# Patient Record
Sex: Male | Born: 1935 | Race: White | Hispanic: No | Marital: Married | Smoking: Never smoker
Health system: Southern US, Community
[De-identification: ages and names within clinical notes are randomized; demographics above are authoritative.]

## PROBLEM LIST (undated history)

## (undated) DIAGNOSIS — E871 Hypo-osmolality and hyponatremia: Secondary | ICD-10-CM

## (undated) DIAGNOSIS — I10 Essential (primary) hypertension: Secondary | ICD-10-CM

## (undated) HISTORY — PX: SPINAL FUSION: SHX223

## (undated) HISTORY — PX: DG ANKLE LEFT COMPLETE (ARMC HX): HXRAD1441

---

## 2016-03-06 ENCOUNTER — Emergency Department
Admission: EM | Admit: 2016-03-06 | Discharge: 2016-03-06 | Disposition: A | Discharge status: Home / Self Care | Payer: Medicare Other | Attending: Emergency Medicine | Admitting: Emergency Medicine

## 2016-03-06 ENCOUNTER — Encounter: Payer: Self-pay | Admitting: Emergency Medicine

## 2016-03-06 DIAGNOSIS — I10 Essential (primary) hypertension: Secondary | ICD-10-CM | POA: Insufficient documentation

## 2016-03-06 HISTORY — DX: Essential (primary) hypertension: I10

## 2016-03-06 LAB — CBC WITH DIFFERENTIAL/PLATELET
Basophils Absolute: 0.1 10*3/uL (ref 0–0.1)
Basophils Relative: 1 %
EOS ABS: 0.1 10*3/uL (ref 0–0.7)
Eosinophils Relative: 1 %
HEMATOCRIT: 38 % — AB (ref 40.0–52.0)
HEMOGLOBIN: 13.1 g/dL (ref 13.0–18.0)
LYMPHS ABS: 1.3 10*3/uL (ref 1.0–3.6)
LYMPHS PCT: 17 %
MCH: 31 pg (ref 26.0–34.0)
MCHC: 34.5 g/dL (ref 32.0–36.0)
MCV: 89.8 fL (ref 80.0–100.0)
MONOS PCT: 6 %
Monocytes Absolute: 0.5 10*3/uL (ref 0.2–1.0)
NEUTROS ABS: 5.9 10*3/uL (ref 1.4–6.5)
NEUTROS PCT: 75 %
Platelets: 252 10*3/uL (ref 150–440)
RBC: 4.23 MIL/uL — AB (ref 4.40–5.90)
RDW: 12.8 % (ref 11.5–14.5)
WBC: 7.8 10*3/uL (ref 3.8–10.6)

## 2016-03-06 LAB — COMPREHENSIVE METABOLIC PANEL
ALK PHOS: 81 U/L (ref 38–126)
ALT: 25 U/L (ref 17–63)
AST: 28 U/L (ref 15–41)
Albumin: 4.5 g/dL (ref 3.5–5.0)
Anion gap: 7 (ref 5–15)
BILIRUBIN TOTAL: 0.7 mg/dL (ref 0.3–1.2)
BUN: 12 mg/dL (ref 6–20)
CALCIUM: 9 mg/dL (ref 8.9–10.3)
CO2: 27 mmol/L (ref 22–32)
CREATININE: 0.67 mg/dL (ref 0.61–1.24)
Chloride: 95 mmol/L — ABNORMAL LOW (ref 101–111)
GFR calc non Af Amer: >60 mL/min (ref >60)
GLUCOSE: 98 mg/dL (ref 65–99)
Potassium: 3.6 mmol/L (ref 3.5–5.1)
SODIUM: 129 mmol/L — AB (ref 135–145)
TOTAL PROTEIN: 8 g/dL (ref 6.5–8.1)

## 2016-03-06 LAB — TROPONIN I: Troponin I: <0.03 ng/mL (ref <0.03)

## 2016-03-06 MED ORDER — METOPROLOL TARTRATE 25 MG PO TABS
12.5000 mg | ORAL_TABLET | Freq: Once | ORAL | Status: AC
  Administered 2016-03-06: 12.5 mg via ORAL
  Filled 2016-03-06: qty 1

## 2016-03-06 MED ORDER — METOPROLOL TARTRATE 25 MG PO TABS: 12.5000 mg | ORAL_TABLET | Freq: Two times a day (BID) | ORAL | 0 refills | Status: DC

## 2016-03-06 MED ORDER — LABETALOL HCL 5 MG/ML IV SOLN
10.0000 mg | Freq: Once | INTRAVENOUS | Status: AC
  Administered 2016-03-06: 10 mg via INTRAVENOUS
  Filled 2016-03-06: qty 4

## 2016-03-06 NOTE — ED Provider Notes (Signed)
[facility] Emergency Department Provider Note  ____________________________________________  Time seen: Approximately 2:38 PM  I have reviewed the triage vital signs and the nursing notes.   HISTORY  Chief Complaint Hypertension   HPI Alejandro Sheppard is a 81 y.o. male the history of hypertension who presents for evaluation of elevated blood pressure. Patient reports that his been dealing with elevated blood pressure for the last 5 years. 3 weeks ago he had his physical exam and he was found to have systolics in the 150s. His doctor increased his medication from amlodipine to and a half to 5 mg a day and patient has also been on benzapril 20mg  for 5 years. Patient has been carefully checking his blood pressure on a daily basis and this morning noticed that his systolics were in the 200s which prompted his visit to the emergency room. Patient denies headache, dizziness, changes in vision, slurred speech, facial droop, unilateral weakness or numbness, chest pain, shortness of breath, abdominal pain, back pain. Patient reports that he is completely asymptomatic and he wouldn't be here if he had not checked his blood pressure. He is concerned he might have a stroke with blood pressure so elevated.  Past Medical History:  Diagnosis Date  . Hypertension     There are no active problems to display for this patient.   History reviewed. No pertinent surgical history.  Prior to Admission medications   Medication Sig Start Date End Date Taking? Authorizing Provider  metoprolol tartrate (LOPRESSOR) 25 MG tablet Take 0.5 tablets (12.5 mg total) by mouth 2 (two) times daily. 03/06/16 03/06/17  Nita Sicklearolina Ladene Allocca, MD    Allergies Penicillins  No family history on file.  Social History Social History  Substance Use Topics  . Smoking status: Never Smoker  . Smokeless tobacco: Not on file  . Alcohol use Not on file    Review of Systems  Constitutional: Negative for  fever. Eyes: Negative for visual changes. ENT: Negative for sore throat. Neck: No neck pain  Cardiovascular: Negative for chest pain. Respiratory: Negative for shortness of breath. Gastrointestinal: Negative for abdominal pain, vomiting or diarrhea. Genitourinary: Negative for dysuria. Musculoskeletal: Negative for back pain. Skin: Negative for rash. Neurological: Negative for headaches, weakness or numbness. Psych: No SI or HI  ____________________________________________   PHYSICAL EXAM:  VITAL SIGNS: ED Triage Vitals  Enc Vitals Group     BP 03/06/16 1124 (!) 199/88     Pulse Rate 03/06/16 1124 88     Resp 03/06/16 1124 18     Temp 03/06/16 1124 97.9 F (36.6 C)     Temp Source 03/06/16 1124 Oral     SpO2 03/06/16 1124 99 %     Weight 03/06/16 1121 150 lb (68 kg)     Height 03/06/16 1121 5\' 9"  (1.753 m)     Head Circumference --      Peak Flow --      Pain Score --      Pain Loc --      Pain Edu? --      Excl. in GC? --     Constitutional: Alert and oriented. Well appearing and in no apparent distress. HEENT:      Head: Normocephalic and atraumatic.         Eyes: Conjunctivae are normal. Sclera is non-icteric. EOMI. PERRL      Mouth/Throat: Mucous membranes are moist.       Neck: Supple with no signs of meningismus. Cardiovascular: Regular rate  and rhythm. II/VI systolic murmur loudest at the apex of the heart. No gallops, or rubs. 2+ symmetrical distal pulses are present in all extremities. No JVD. Respiratory: Normal respiratory effort. Lungs are clear to auscultation bilaterally. No wheezes, crackles, or rhonchi.  Gastrointestinal: Soft, non tender, and non distended with positive bowel sounds. No rebound or guarding. Musculoskeletal: Nontender with normal range of motion in all extremities. No edema, cyanosis, or erythema of extremities. Neurologic: Normal speech and language. A & O x3, PERRL, no nystagmus, CN II-XII intact, motor testing reveals good tone and  bulk throughout. There is no evidence of pronator drift or dysmetria. Muscle strength is 5/5 throughout. Deep tendon reflexes are 2+ throughout with downgoing toes. Sensory examination is intact. Gait is normal. Skin: Skin is warm, dry and intact. No rash noted. Psychiatric: Mood and affect are normal. Speech and behavior are normal.  ____________________________________________   LABS (all labs ordered are listed, but only abnormal results are displayed)  Labs Reviewed  CBC WITH DIFFERENTIAL/PLATELET - Abnormal; Notable for the following:       Result Value   RBC 4.23 (*)    HCT 38.0 (*)    All other components within normal limits  COMPREHENSIVE METABOLIC PANEL - Abnormal; Notable for the following:    Sodium 129 (*)    Chloride 95 (*)    All other components within normal limits  TROPONIN I   ____________________________________________  EKG  ED ECG REPORT I, Nita Sicklearolina Jaelan Rasheed, the attending physician, personally viewed and interpreted this ECG.  Normal sinus rhythm, rate of 67, normal intervals, normal axis, no ST elevations or depressions. ____________________________________________  RADIOLOGY  none  ____________________________________________   PROCEDURES  Procedure(s) performed: None Procedures Critical Care performed:  None ____________________________________________   INITIAL IMPRESSION / ASSESSMENT AND PLAN / ED COURSE   81 y.o. male the history of hypertension who presents for evaluation of asymptomatic elevated blood pressure. Patient is very concerned that he will have a stroke due to elevated blood pressure. He is neurologically intact and has no symptoms of hypertensive emergency. EKG with no evidence of ischemia, kidney function is within normal limits, CBC, CMP, troponin all within normal limits. Patient's blood pressure during my examination with systolics in the 230s. We'll give him a dose of IV labetalol and start patient on metoprolol 12.5 mg  twice a day. We'll have patient follow-up with his PCP on Monday  Clinical Course as of Mar 06 1538  Fri Mar 06, 2016  1536 SPB 181 from 232, patient remains asymptomatic. Will be discharged home on lopressor 12.5mg  BID and close follow-up with PCP on Monday. Discussed return precautions with the patient and his wife for any signs of stroke, chest pain, back pain, or any other symptoms and recommended he return to the emergency room if these develop.  [CV]    Clinical Course User Index [CV] Nita Sicklearolina Ayden Hardwick, MD    Pertinent labs & imaging results that were available during my care of the patient were reviewed by me and considered in my medical decision making (see chart for details).    ____________________________________________   FINAL CLINICAL IMPRESSION(S) / ED DIAGNOSES  Final diagnoses:  Asymptomatic hypertension      NEW MEDICATIONS STARTED DURING THIS VISIT:  New Prescriptions   METOPROLOL TARTRATE (LOPRESSOR) 25 MG TABLET    Take 0.5 tablets (12.5 mg total) by mouth 2 (two) times daily.     Note:  This document was prepared using Conservation officer, historic buildingsDragon voice recognition software and may include  unintentional dictation errors.    Nita Sickle, MD 03/06/16 1539

## 2016-03-06 NOTE — ED Triage Notes (Signed)
Reports checking bp at home and it was 200/  . Denies sx.  Skin w/d, MAE, smile symmetrical.

## 2016-03-06 NOTE — ED Notes (Addendum)
PT reports having seen PCP three weeks ago and had his amlodipine increased from 2.5 mg once a day to 5mg  once a day. Pt also taking Benazepril 20 mg once a day for HTN. PT has BP trends documented at bedside. BP has been elevated and slowly increasing throughout the week with highest readings starting today.

## 2017-03-14 ENCOUNTER — Emergency Department: Payer: Medicare Other

## 2017-03-14 ENCOUNTER — Encounter: Payer: Self-pay | Admitting: Emergency Medicine

## 2017-03-14 ENCOUNTER — Other Ambulatory Visit: Payer: Self-pay

## 2017-03-14 ENCOUNTER — Emergency Department
Admission: EM | Admit: 2017-03-14 | Discharge: 2017-03-14 | Disposition: A | Discharge status: Home / Self Care | Payer: Medicare Other | Attending: Student in an Organized Health Care Education/Training Program | Admitting: Student in an Organized Health Care Education/Training Program

## 2017-03-14 DIAGNOSIS — I10 Essential (primary) hypertension: Secondary | ICD-10-CM | POA: Diagnosis not present

## 2017-03-14 LAB — BASIC METABOLIC PANEL
Anion gap: 9 (ref 5–15)
BUN: 15 mg/dL (ref 6–20)
CALCIUM: 9.2 mg/dL (ref 8.9–10.3)
CO2: 24 mmol/L (ref 22–32)
CREATININE: 0.72 mg/dL (ref 0.61–1.24)
Chloride: 98 mmol/L — ABNORMAL LOW (ref 101–111)
GFR calc Af Amer: >60 mL/min (ref >60)
GFR calc non Af Amer: >60 mL/min (ref >60)
GLUCOSE: 99 mg/dL (ref 65–99)
Potassium: 4.2 mmol/L (ref 3.5–5.1)
Sodium: 131 mmol/L — ABNORMAL LOW (ref 135–145)

## 2017-03-14 MED ORDER — AMLODIPINE BESYLATE 5 MG PO TABS
5.0000 mg | ORAL_TABLET | Freq: Once | ORAL | Status: AC
  Administered 2017-03-14: 5 mg via ORAL

## 2017-03-14 MED ORDER — AMLODIPINE BESYLATE 5 MG PO TABS
ORAL_TABLET | ORAL | Status: AC
  Filled 2017-03-14: qty 1

## 2017-03-14 NOTE — ED Provider Notes (Signed)
[facility] Emergency Department Provider Note    First MD Initiated Contact with Patient 03/14/17 1833     (approximate)  I have reviewed the triage vital signs and the nursing notes.   HISTORY  Chief Complaint Hypertension    HPI Alejandro Sheppard is a 82 y.o. male with a history of hypertension with no recent hospitalizations presents for evaluation of elevated blood pressure.  States he has not missed any doses.  He is asymptomatic.  He denies any blurry vision, headache, numbness, weakness, chest pain, shortness of breath, lower extremity swelling.  Has had episodes of blood pressure spikes roughly 3-4 times over the past several years.  Does follow with PCP.  Denies any new medications, stressors  Past Medical History:  Diagnosis Date  . Hypertension    History reviewed. No pertinent family history. History reviewed. No pertinent surgical history. There are no active problems to display for this patient.     Prior to Admission medications   Medication Sig Start Date End Date Taking? Authorizing Provider  metoprolol tartrate (LOPRESSOR) 25 MG tablet Take 0.5 tablets (12.5 mg total) by mouth 2 (two) times daily. 03/06/16 03/06/17  Nita SickleVeronese, [place], MD    Allergies Penicillins    Social History Social History   Tobacco Use  . Smoking status: Never Smoker  . Smokeless tobacco: Never Used  Substance Use Topics  . Alcohol use: No    Frequency: Never  . Drug use: No    Review of Systems Patient denies headaches, rhinorrhea, blurry vision, numbness, shortness of breath, chest pain, edema, cough, abdominal pain, nausea, vomiting, diarrhea, dysuria, fevers, rashes or hallucinations unless otherwise stated above in HPI. ____________________________________________   PHYSICAL EXAM:  VITAL SIGNS: Vitals:   03/14/17 1824  BP: (!) 216/93  Pulse: (!) 59  Resp: 16  Temp: 98.5 F (36.9 C)  SpO2: 99%    Constitutional: Alert and  oriented. Well appearing and in no acute distress. Eyes: Conjunctivae are normal.  Head: Atraumatic. Nose: No congestion/rhinnorhea. Mouth/Throat: Mucous membranes are moist.   Neck: Painless ROM.  Cardiovascular:   Good peripheral circulation. Respiratory: Normal respiratory effort.  No retractions.  Gastrointestinal: Soft and nontender.  Musculoskeletal: No lower extremity tenderness .  No joint effusions. Neurologic:  Normal speech and language. No gross focal neurologic deficits are appreciated.  Skin:  Skin is warm, dry and intact. No rash noted. Psychiatric: Mood and affect are normal. Speech and behavior are normal.  ____________________________________________   LABS (all labs ordered are listed, but only abnormal results are displayed)  No results found for this or any previous visit (from the past 24 hour(s)). ____________________________________________  EKG My review and personal interpretation at Time: 19:19   Indication: htn  Rate: 50  Rhythm: sinus Axis: normal  Other: normal intervals, no stemi ____________________________________________  RADIOLOGY  I personally reviewed all radiographic images ordered to evaluate for the above acute complaints and reviewed radiology reports and findings.  These findings were personally discussed with the patient.  Please see medical record for radiology report.  ____________________________________________   PROCEDURES  Procedure(s) performed:  Procedures    Critical Care performed: no ____________________________________________   INITIAL IMPRESSION / ASSESSMENT AND PLAN / ED COURSE  Pertinent labs & imaging results that were available during my care of the patient were reviewed by me and considered in my medical decision making (see chart for details).  DDX: htn, htnive urgency, medication noncompliance, aki  Alejandro Sheppard is a 82 y.o.  who presents to the ED with asymptomatic hypertension.  EKG shows no evidence  of acute ischemia.  BMP shows normal renal function.  Chest x-ray with no evidence of cardiomegaly or acute heart failure.  Patient given dose of his oral amlodipine with improvement in his blood pressure.  He has no signs or symptoms of CVA.  This point I do believe patient is stable and appropriate for follow-up with PCP.      ____________________________________________   FINAL CLINICAL IMPRESSION(S) / ED DIAGNOSES  Final diagnoses:  Asymptomatic hypertension      NEW MEDICATIONS STARTED DURING THIS VISIT:  This SmartLink is deprecated. Use AVSMEDLIST instead to display the medication list for a patient.   Note:  This document was prepared using Dragon voice recognition software and may include unintentional dictation errors.     Willy Eddyobinson, Yuritzi Kamp, MD 03/14/17 [phone]

## 2017-03-14 NOTE — ED Notes (Signed)
First nurse note  Presents with family with high blood pressure  Denies any sx's

## 2017-03-14 NOTE — ED Triage Notes (Signed)
Pt here today for elevated BP at home.  Hx HTN.  Reports couple times a year gets episode where BP increases.  Denies any other symptoms such as headache, CP, vision changes, or nose bleeds.

## 2018-05-03 ENCOUNTER — Other Ambulatory Visit: Payer: Self-pay | Admitting: Student

## 2018-05-03 DIAGNOSIS — R1310 Dysphagia, unspecified: Secondary | ICD-10-CM

## 2018-05-10 ENCOUNTER — Ambulatory Visit
Admission: RE | Admit: 2018-05-10 | Discharge: 2018-05-10 | Disposition: A | Discharge status: Home / Self Care | Payer: Medicare Other | Source: Ambulatory Visit | Attending: Student | Admitting: Student

## 2018-05-10 ENCOUNTER — Other Ambulatory Visit: Payer: Self-pay | Admitting: Student

## 2018-05-10 DIAGNOSIS — R1310 Dysphagia, unspecified: Secondary | ICD-10-CM

## 2018-09-27 ENCOUNTER — Other Ambulatory Visit: Payer: Self-pay | Admitting: Student

## 2018-09-27 DIAGNOSIS — R05 Cough: Secondary | ICD-10-CM

## 2018-09-27 DIAGNOSIS — R1310 Dysphagia, unspecified: Secondary | ICD-10-CM

## 2018-09-27 DIAGNOSIS — R053 Chronic cough: Secondary | ICD-10-CM

## 2018-09-30 ENCOUNTER — Emergency Department: Payer: Medicare Other

## 2018-09-30 ENCOUNTER — Other Ambulatory Visit: Payer: Self-pay

## 2018-09-30 ENCOUNTER — Inpatient Hospital Stay
Admission: EM | Admit: 2018-09-30 | Discharge: 2018-10-01 | DRG: 641 | Disposition: A | Discharge status: Home / Self Care | Payer: Medicare Other | Attending: Internal Medicine | Admitting: Internal Medicine

## 2018-09-30 ENCOUNTER — Encounter: Payer: Self-pay | Admitting: Emergency Medicine

## 2018-09-30 DIAGNOSIS — E785 Hyperlipidemia, unspecified: Secondary | ICD-10-CM | POA: Diagnosis not present

## 2018-09-30 DIAGNOSIS — Z981 Arthrodesis status: Secondary | ICD-10-CM

## 2018-09-30 DIAGNOSIS — F418 Other specified anxiety disorders: Secondary | ICD-10-CM | POA: Diagnosis present

## 2018-09-30 DIAGNOSIS — Z823 Family history of stroke: Secondary | ICD-10-CM | POA: Diagnosis not present

## 2018-09-30 DIAGNOSIS — R296 Repeated falls: Secondary | ICD-10-CM | POA: Diagnosis present

## 2018-09-30 DIAGNOSIS — I10 Essential (primary) hypertension: Secondary | ICD-10-CM | POA: Diagnosis not present

## 2018-09-30 DIAGNOSIS — E871 Hypo-osmolality and hyponatremia: Principal | ICD-10-CM | POA: Diagnosis present

## 2018-09-30 DIAGNOSIS — R531 Weakness: Secondary | ICD-10-CM

## 2018-09-30 DIAGNOSIS — Z880 Allergy status to penicillin: Secondary | ICD-10-CM | POA: Diagnosis not present

## 2018-09-30 DIAGNOSIS — Z1159 Encounter for screening for other viral diseases: Secondary | ICD-10-CM | POA: Diagnosis not present

## 2018-09-30 DIAGNOSIS — Z818 Family history of other mental and behavioral disorders: Secondary | ICD-10-CM

## 2018-09-30 DIAGNOSIS — G4700 Insomnia, unspecified: Secondary | ICD-10-CM | POA: Diagnosis present

## 2018-09-30 DIAGNOSIS — E860 Dehydration: Secondary | ICD-10-CM | POA: Diagnosis not present

## 2018-09-30 DIAGNOSIS — Z79899 Other long term (current) drug therapy: Secondary | ICD-10-CM | POA: Diagnosis not present

## 2018-09-30 HISTORY — DX: Hypo-osmolality and hyponatremia: E87.1

## 2018-09-30 LAB — CBC
HCT: 34.2 % — ABNORMAL LOW (ref 39.0–52.0)
Hemoglobin: 12.3 g/dL — ABNORMAL LOW (ref 13.0–17.0)
MCH: 31.5 pg (ref 26.0–34.0)
MCHC: 36 g/dL (ref 30.0–36.0)
MCV: 87.5 fL (ref 80.0–100.0)
Platelets: 170 10*3/uL (ref 150–400)
RBC: 3.91 MIL/uL — ABNORMAL LOW (ref 4.22–5.81)
RDW: 12.2 % (ref 11.5–15.5)
WBC: 6.5 10*3/uL (ref 4.0–10.5)
nRBC: 0 % (ref 0.0–0.2)

## 2018-09-30 LAB — BASIC METABOLIC PANEL
Anion gap: 12 (ref 5–15)
BUN: 11 mg/dL (ref 8–23)
CO2: 23 mmol/L (ref 22–32)
Calcium: 9.3 mg/dL (ref 8.9–10.3)
Chloride: 87 mmol/L — ABNORMAL LOW (ref 98–111)
Creatinine, Ser: 0.78 mg/dL (ref 0.61–1.24)
GFR calc Af Amer: >60 mL/min (ref >60)
GFR calc non Af Amer: >60 mL/min (ref >60)
Glucose, Bld: 109 mg/dL — ABNORMAL HIGH (ref 70–99)
Potassium: 3.9 mmol/L (ref 3.5–5.1)
Sodium: 122 mmol/L — ABNORMAL LOW (ref 135–145)

## 2018-09-30 LAB — HEPATIC FUNCTION PANEL
ALT: 21 U/L (ref 0–44)
AST: 25 U/L (ref 15–41)
Albumin: 4.5 g/dL (ref 3.5–5.0)
Alkaline Phosphatase: 53 U/L (ref 38–126)
Bilirubin, Direct: 0.2 mg/dL (ref 0.0–0.2)
Indirect Bilirubin: 0.6 mg/dL (ref 0.3–0.9)
Total Bilirubin: 0.8 mg/dL (ref 0.3–1.2)
Total Protein: 6.7 g/dL (ref 6.5–8.1)

## 2018-09-30 LAB — OSMOLALITY, URINE: Osmolality, Ur: 81 mOsm/kg — ABNORMAL LOW (ref 300–900)

## 2018-09-30 LAB — SODIUM, URINE, RANDOM: Sodium, Ur: <10 mmol/L

## 2018-09-30 LAB — SARS CORONAVIRUS 2 BY RT PCR (HOSPITAL ORDER, PERFORMED IN ~~LOC~~ HOSPITAL LAB): SARS Coronavirus 2: NEGATIVE

## 2018-09-30 MED ORDER — ONDANSETRON HCL 4 MG PO TABS: 4.0000 mg | ORAL_TABLET | Freq: Four times a day (QID) | ORAL | Status: DC | PRN

## 2018-09-30 MED ORDER — ONDANSETRON HCL 4 MG/2ML IJ SOLN: 4.0000 mg | Freq: Four times a day (QID) | INTRAMUSCULAR | Status: DC | PRN

## 2018-09-30 MED ORDER — ATORVASTATIN CALCIUM 20 MG PO TABS
40.0000 mg | ORAL_TABLET | Freq: Every day | ORAL | Status: DC
  Administered 2018-09-30: 19:00:00 40 mg via ORAL
  Filled 2018-09-30: qty 2

## 2018-09-30 MED ORDER — TRAZODONE HCL 50 MG PO TABS
50.0000 mg | ORAL_TABLET | Freq: Every day | ORAL | Status: DC
  Administered 2018-09-30: 50 mg via ORAL
  Filled 2018-09-30: qty 1

## 2018-09-30 MED ORDER — SODIUM CHLORIDE 0.9 % IV BOLUS
500.0000 mL | Freq: Once | INTRAVENOUS | Status: AC
  Administered 2018-09-30: 500 mL via INTRAVENOUS

## 2018-09-30 MED ORDER — METOPROLOL TARTRATE 25 MG PO TABS
25.0000 mg | ORAL_TABLET | Freq: Two times a day (BID) | ORAL | Status: DC
  Administered 2018-10-01: 10:00:00 25 mg via ORAL
  Filled 2018-09-30 (×2): qty 1

## 2018-09-30 MED ORDER — ASPIRIN EC 81 MG PO TBEC
81.0000 mg | DELAYED_RELEASE_TABLET | Freq: Every day | ORAL | Status: DC
  Administered 2018-10-01: 81 mg via ORAL
  Filled 2018-09-30: qty 1

## 2018-09-30 MED ORDER — CITALOPRAM HYDROBROMIDE 20 MG PO TABS
10.0000 mg | ORAL_TABLET | Freq: Every day | ORAL | Status: DC
  Administered 2018-10-01: 10 mg via ORAL
  Filled 2018-09-30: qty 1

## 2018-09-30 MED ORDER — BENAZEPRIL HCL 20 MG PO TABS
20.0000 mg | ORAL_TABLET | Freq: Every day | ORAL | Status: DC
  Administered 2018-10-01: 20 mg via ORAL
  Filled 2018-09-30: qty 1

## 2018-09-30 MED ORDER — ACETAMINOPHEN 650 MG RE SUPP: 650.0000 mg | Freq: Four times a day (QID) | RECTAL | Status: DC | PRN

## 2018-09-30 MED ORDER — SODIUM CHLORIDE 0.9 % IV SOLN
INTRAVENOUS | Status: DC
  Administered 2018-09-30: 19:00:00 via INTRAVENOUS

## 2018-09-30 MED ORDER — AMLODIPINE BESYLATE 5 MG PO TABS
10.0000 mg | ORAL_TABLET | Freq: Every day | ORAL | Status: DC
  Administered 2018-10-01: 10 mg via ORAL
  Filled 2018-09-30: qty 2

## 2018-09-30 MED ORDER — ACETAMINOPHEN 325 MG PO TABS
650.0000 mg | ORAL_TABLET | Freq: Four times a day (QID) | ORAL | Status: DC | PRN
  Administered 2018-10-01: 650 mg via ORAL
  Filled 2018-09-30: qty 2

## 2018-09-30 MED ORDER — ENOXAPARIN SODIUM 40 MG/0.4ML ~~LOC~~ SOLN
40.0000 mg | SUBCUTANEOUS | Status: DC
  Administered 2018-09-30: 40 mg via SUBCUTANEOUS
  Filled 2018-09-30: qty 0.4

## 2018-09-30 NOTE — H&P (Signed)
[facility] [place] [facility]   PATIENT NAME: Alejandro Sheppard Domingo    MR#:  [id number]  DATE OF BIRTH:  1934-07-05  DATE OF ADMISSION:  09/30/2018  PRIMARY CARE PHYSICIAN: Dr. Burnett ShengHedrick  REQUESTING/REFERRING PHYSICIAN: Dr. Willy EddyPatrick Robinson  CHIEF COMPLAINT:   Chief Complaint  Patient presents with  . Abnormal Labs    HISTORY OF PRESENT ILLNESS:  Alejandro Sheppard Hedberg  is a 84 y.o. male with a known history of hypertension and hyponatremia presents to the hospital after being called by his doctor to come in because of low sodium.  The patient states he takes hydrochlorothiazide at home.  His doctor called him yesterday because of the low sodium and told him to stop the hydrochlorothiazide and drink some water.  They repeated blood test today and sodium was still low and they referred him to the ER.  In the ER his sodium is 122.  Hospitalist services contacted for further evaluation.  Of note the patient did have a fall about 10 days ago and bruising bilateral face and around the eyes.  I asked him where his hands were when he fell.  The patient stated that he could not get his hands up in time.  PAST MEDICAL HISTORY:   Past Medical History:  Diagnosis Date  . Hypertension   . Hyponatremia     PAST SURGICAL HISTORY:   Past Surgical History:  Procedure Laterality Date  . DG ANKLE LEFT COMPLETE ([facility] HX)    . SPINAL FUSION      SOCIAL HISTORY:   Social History   Tobacco Use  . Smoking status: Never Smoker  . Smokeless tobacco: Never Used  Substance Use Topics  . Alcohol use: No    Frequency: Never    FAMILY HISTORY:   Family History  Problem Relation Age of Onset  . CVA Mother   . Depression Father     DRUG ALLERGIES:   Allergies  Allergen Reactions  . Penicillins Rash    REVIEW OF SYSTEMS:  CONSTITUTIONAL: No fever, fatigue or weakness.  EYES: No blurred or double vision.  EARS, NOSE, AND THROAT: No tinnitus or ear pain. No sore  throat RESPIRATORY: Chronic cough cough, no shortness of breath, wheezing or hemoptysis.  CARDIOVASCULAR: No chest pain, orthopnea, edema.  GASTROINTESTINAL: No nausea, vomiting, diarrhea or abdominal pain. No blood in bowel movements GENITOURINARY: No dysuria, hematuria.  ENDOCRINE: No polyuria, nocturia,  HEMATOLOGY: No anemia, easy bruising or bleeding SKIN: No rash or lesion. MUSCULOSKELETAL: No joint pain or arthritis.   NEUROLOGIC: No tingling, numbness, weakness.  PSYCHIATRY: No anxiety or depression.   MEDICATIONS AT HOME:   Prior to Admission medications   Medication Sig Start Date End Date Taking? Authorizing Provider  metoprolol tartrate (LOPRESSOR) 25 MG tablet Take 0.5 tablets (12.5 mg total) by mouth 2 (two) times daily. 03/06/16 03/06/17  Nita SickleVeronese, [place], MD   Medication reconciliation still needs to be done.  Benazepril 20 mg daily, Norvasc 10 mg daily, metoprolol 25 mg twice daily, hydrochlorothiazide 12.5 mg daily, aspirin 81 mg daily, Celexa 10 mg daily, trazodone 50 mg daily and Lipitor 40 mg nightly.  VITAL SIGNS:  Blood pressure (!) 151/57, pulse (!) 53, temperature 99.7 F (37.6 C), temperature source Oral, resp. rate 18, SpO2 100 %.  PHYSICAL EXAMINATION:  GENERAL:  84 y.o.-year-old patient lying in the bed with no acute distress.  EYES: Pupils equal, round, reactive to light and accommodation. No scleral icterus. Extraocular muscles intact.  HEENT: Head atraumatic,  normocephalic. Oropharynx and nasopharynx clear.  NECK:  Supple, no jugular venous distention. No thyroid enlargement, no tenderness.  LUNGS: Normal breath sounds bilaterally, no wheezing, rales,rhonchi or crepitation. No use of accessory muscles of respiration.  CARDIOVASCULAR: S1, S2 normal. No murmurs, rubs, or gallops.  ABDOMEN: Soft, nontender, nondistended. Bowel sounds present. No organomegaly or mass.  EXTREMITIES: No pedal edema, cyanosis, or clubbing.  NEUROLOGIC: Cranial nerves II  through XII are intact. Muscle strength 5/5 in all extremities. Sensation intact. Gait not checked.  PSYCHIATRIC: The patient is alert and oriented x 3.  SKIN: Bruising bilateral face and around the eyes.  LABORATORY PANEL:   CBC Recent Labs  Lab 09/30/18 1235  WBC 6.5  HGB 12.3*  HCT 34.2*  PLT 170   ------------------------------------------------------------------------------------------------------------------  Chemistries  Recent Labs  Lab 09/30/18 1235  NA 122*  K 3.9  CL 87*  CO2 23  GLUCOSE 109*  BUN 11  CREATININE 0.78  CALCIUM 9.3  AST 25  ALT 21  ALKPHOS 53  BILITOT 0.8   ------------------------------------------------------------------------------------------------------------------    RADIOLOGY:  Ct Head Wo Contrast  Result Date: 09/30/2018 CLINICAL DATA:  Facial swelling and neck pain after fall 10 days ago. EXAM: CT HEAD WITHOUT CONTRAST CT MAXILLOFACIAL WITHOUT CONTRAST CT CERVICAL SPINE WITHOUT CONTRAST TECHNIQUE: Multidetector CT imaging of the head, cervical spine, and maxillofacial structures were performed using the standard protocol without intravenous contrast. Multiplanar CT image reconstructions of the cervical spine and maxillofacial structures were also generated. COMPARISON:  None. FINDINGS: CT HEAD FINDINGS Brain: Mild chronic ischemic white matter disease is noted. No mass effect or midline shift is noted. Ventricular size is within normal limits. There is no evidence of mass lesion, hemorrhage or acute infarction. Vascular: No hyperdense vessel or unexpected calcification. Skull: Normal. Negative for fracture or focal lesion. Other: None. CT MAXILLOFACIAL FINDINGS Osseous: No fracture or mandibular dislocation. No destructive process. Orbits: Negative. No traumatic or inflammatory finding. Sinuses: Clear. Soft tissues: Negative. CT CERVICAL SPINE FINDINGS Alignment: Grade 1 retrolisthesis of C3-4 is noted secondary to severe degenerative disc  disease at this level. Skull base and vertebrae: No acute fracture. No primary bone lesion or focal pathologic process. Soft tissues and spinal canal: No prevertebral fluid or swelling. No visible canal hematoma. Disc levels: There is fusion of the C4-5, C5-6 and C6-7 disc spaces, most likely due to degenerative change. Severe degenerative disc disease is also noted at C7-T1. Upper chest: Negative. Other: Degenerative changes are seen involving posterior facet joints bilaterally. IMPRESSION: Mild chronic ischemic white matter disease. No acute intracranial abnormality seen. No abnormality seen in maxillofacial region. Extensive multilevel degenerative disc disease is noted. No acute abnormality seen in the cervical spine. Electronically Signed   By: Marijo Conception M.D.   On: 09/30/2018 14:12   Ct Cervical Spine Wo Contrast  Result Date: 09/30/2018 CLINICAL DATA:  Facial swelling and neck pain after fall 10 days ago. EXAM: CT HEAD WITHOUT CONTRAST CT MAXILLOFACIAL WITHOUT CONTRAST CT CERVICAL SPINE WITHOUT CONTRAST TECHNIQUE: Multidetector CT imaging of the head, cervical spine, and maxillofacial structures were performed using the standard protocol without intravenous contrast. Multiplanar CT image reconstructions of the cervical spine and maxillofacial structures were also generated. COMPARISON:  None. FINDINGS: CT HEAD FINDINGS Brain: Mild chronic ischemic white matter disease is noted. No mass effect or midline shift is noted. Ventricular size is within normal limits. There is no evidence of mass lesion, hemorrhage or acute infarction. Vascular: No hyperdense vessel or unexpected calcification. Skull:  Normal. Negative for fracture or focal lesion. Other: None. CT MAXILLOFACIAL FINDINGS Osseous: No fracture or mandibular dislocation. No destructive process. Orbits: Negative. No traumatic or inflammatory finding. Sinuses: Clear. Soft tissues: Negative. CT CERVICAL SPINE FINDINGS Alignment: Grade 1  retrolisthesis of C3-4 is noted secondary to severe degenerative disc disease at this level. Skull base and vertebrae: No acute fracture. No primary bone lesion or focal pathologic process. Soft tissues and spinal canal: No prevertebral fluid or swelling. No visible canal hematoma. Disc levels: There is fusion of the C4-5, C5-6 and C6-7 disc spaces, most likely due to degenerative change. Severe degenerative disc disease is also noted at C7-T1. Upper chest: Negative. Other: Degenerative changes are seen involving posterior facet joints bilaterally. IMPRESSION: Mild chronic ischemic white matter disease. No acute intracranial abnormality seen. No abnormality seen in maxillofacial region. Extensive multilevel degenerative disc disease is noted. No acute abnormality seen in the cervical spine. Electronically Signed   By: Lupita RaiderJames  Green Jr M.D.   On: 09/30/2018 14:12   Dg Chest Portable 1 View  Result Date: 09/30/2018 CLINICAL DATA:  Hyponatremia.  Weakness. EXAM: PORTABLE CHEST 1 VIEW COMPARISON:  March 14, 2017 FINDINGS: There is no edema or consolidation. The heart size and pulmonary vascularity are normal. No adenopathy. There is aortic atherosclerosis. There is degenerative change in each shoulder. There is colonic interposition between the right hemidiaphragm and liver. IMPRESSION: No edema or consolidation. Stable cardiac silhouette. Aortic Atherosclerosis (ICD10-I70.0). Electronically Signed   By: Bretta BangWilliam  Woodruff III M.D.   On: 09/30/2018 13:22   Ct Maxillofacial Wo Contrast  Result Date: 09/30/2018 CLINICAL DATA:  Facial swelling and neck pain after fall 10 days ago. EXAM: CT HEAD WITHOUT CONTRAST CT MAXILLOFACIAL WITHOUT CONTRAST CT CERVICAL SPINE WITHOUT CONTRAST TECHNIQUE: Multidetector CT imaging of the head, cervical spine, and maxillofacial structures were performed using the standard protocol without intravenous contrast. Multiplanar CT image reconstructions of the cervical spine and  maxillofacial structures were also generated. COMPARISON:  None. FINDINGS: CT HEAD FINDINGS Brain: Mild chronic ischemic white matter disease is noted. No mass effect or midline shift is noted. Ventricular size is within normal limits. There is no evidence of mass lesion, hemorrhage or acute infarction. Vascular: No hyperdense vessel or unexpected calcification. Skull: Normal. Negative for fracture or focal lesion. Other: None. CT MAXILLOFACIAL FINDINGS Osseous: No fracture or mandibular dislocation. No destructive process. Orbits: Negative. No traumatic or inflammatory finding. Sinuses: Clear. Soft tissues: Negative. CT CERVICAL SPINE FINDINGS Alignment: Grade 1 retrolisthesis of C3-4 is noted secondary to severe degenerative disc disease at this level. Skull base and vertebrae: No acute fracture. No primary bone lesion or focal pathologic process. Soft tissues and spinal canal: No prevertebral fluid or swelling. No visible canal hematoma. Disc levels: There is fusion of the C4-5, C5-6 and C6-7 disc spaces, most likely due to degenerative change. Severe degenerative disc disease is also noted at C7-T1. Upper chest: Negative. Other: Degenerative changes are seen involving posterior facet joints bilaterally. IMPRESSION: Mild chronic ischemic white matter disease. No acute intracranial abnormality seen. No abnormality seen in maxillofacial region. Extensive multilevel degenerative disc disease is noted. No acute abnormality seen in the cervical spine. Electronically Signed   By: Lupita RaiderJames  Green Jr M.D.   On: 09/30/2018 14:12     IMPRESSION AND PLAN:   1.  Severe hyponatremia.  Gentle IV fluids with normal saline.  Discontinue hydrochlorothiazide.  I told the patient and the patient's wife that this medication must go in the garbage.  He  should never take hydrochlorothiazide again.  The other medication that can cause low sodium is Celexa but I will continue that low dose for right now.  If sodium still remains low  this may be another medication to discontinue. 2.  Hypertension.  Continue Norvasc, lower the dose of metoprolol, continue benazepril 3.  Hyperlipidemia unspecified on Lipitor 4.  Depression anxiety and insomnia continue Celexa and trazodone.    All the records are reviewed and case discussed with ED provider. Management plans discussed with the patient, family and they are in agreement.  CODE STATUS: Full code  TOTAL TIME TAKING CARE OF THIS PATIENT: 50 minutes.    Alford Highlandichard Spyridon Hornstein M.D on 09/30/2018 at 3:35 PM  Between 7am to 6pm - Pager - [phone]  After 6pm call admission pager [phone]  [facility] Office  [phone]  CC: Primary care physician; Dr. Burnett ShengHedrick

## 2018-09-30 NOTE — ED Provider Notes (Signed)
[facility] Emergency Department Provider Note    First MD Initiated Contact with Patient 09/30/18 1229     (approximate)  I have reviewed the triage vital signs and the nursing notes.   HISTORY  Chief Complaint Abnormal Labs    HPI Alejandro Sheppard is a 84 y.o. male to the ER due to several days and weeks of progressively worsening weakness of fatigue.  Has been found to have falling sodium levels.  Has had several falls 110 days ago resulting in head injury and has bruising to the face.  Denies any numbness or tingling.  No witnessed seizure activity.  Was recently told to stop taking his HCTZ and follow-up for recheck but his sodium has continued to downtrend.  Denies any nausea or vomiting.  No diarrhea.    Past Medical History:  Diagnosis Date   Hypertension    Hyponatremia    Family History  Problem Relation Age of Onset   CVA Mother    Depression Father    Past Surgical History:  Procedure Laterality Date   DG ANKLE LEFT COMPLETE ([place] HX)     SPINAL FUSION     Patient Active Problem List   Diagnosis Date Noted   Hyponatremia 09/30/2018      Prior to Admission medications   Medication Sig Start Date End Date Taking? Authorizing Provider  amLODipine (NORVASC) 10 MG tablet Take 10 mg by mouth daily. 08/23/18  Yes [provider]  aspirin EC 81 MG tablet Take 81 mg by mouth at bedtime. 12/10/08  Yes [provider]  atorvastatin (LIPITOR) 40 MG tablet Take 40 mg by mouth every evening.    Yes [provider]  benazepril (LOTENSIN) 40 MG tablet Take 40 mg by mouth daily. 05/02/18 05/02/19 Yes [provider]  citalopram (CELEXA) 20 MG tablet Take 30 mg by mouth daily. 09/13/18  Yes [provider]  metoprolol tartrate (LOPRESSOR) 50 MG tablet Take 50 mg by mouth 2 (two) times a day. 12/08/17  Yes [provider]  polyvinyl alcohol (ARTIFICIAL TEARS) 1.4 % ophthalmic solution Apply 1-2 drops  to eye daily as needed.   Yes [provider]  traZODone (DESYREL) 50 MG tablet Take 250 mg by mouth at bedtime. 09/13/18  Yes [provider]    Allergies Penicillins    Social History Social History   Tobacco Use   Smoking status: Never Smoker   Smokeless tobacco: Never Used  Substance Use Topics   Alcohol use: No    Frequency: Never   Drug use: No    Review of Systems Patient denies headaches, rhinorrhea, blurry vision, numbness, shortness of breath, chest pain, edema, cough, abdominal pain, nausea, vomiting, diarrhea, dysuria, fevers, rashes or hallucinations unless otherwise stated above in HPI. ____________________________________________   PHYSICAL EXAM:  VITAL SIGNS: Vitals:   09/30/18 1500 09/30/18 1505  BP:  (!) 151/57  Pulse: (!) 54 (!) 53  Resp: 10 18  Temp:    SpO2: 99% 100%    Constitutional: Alert, pleasant and cooperative Eyes: Conjunctivae are normal. EOMI Head: ecchymosis of right forehead and periorbital  Nose: No congestion/rhinnorhea. Mouth/Throat: Mucous membranes are moist.   Neck: No stridor. Painless ROM.  Cardiovascular: Normal rate, regular rhythm. Grossly normal heart sounds.  Good peripheral circulation. Respiratory: Normal respiratory effort.  No retractions. Lungs CTAB. Gastrointestinal: Soft and nontender. No distention. No abdominal bruits. No CVA tenderness. Genitourinary:  Musculoskeletal: No lower extremity tenderness nor edema.  No joint effusions.  Neurologic:  Normal speech and language. No gross focal neurologic deficits are appreciated. No facial droop Skin:  Skin is warm, dry and intact. No rash noted. Psychiatric: Mood and affect are normal. Speech and behavior are normal.  ____________________________________________   LABS (all labs ordered are listed, but only abnormal results are displayed)  Results for orders placed or performed during the hospital encounter of 09/30/18 (from the past 24  hour(s))  Basic metabolic panel     Status: Abnormal   Collection Time: 09/30/18 12:35 PM  Result Value Ref Range   Sodium 122 (L) 135 - 145 mmol/L   Potassium 3.9 3.5 - 5.1 mmol/L   Chloride 87 (L) 98 - 111 mmol/L   CO2 23 22 - 32 mmol/L   Glucose, Bld 109 (H) 70 - 99 mg/dL   BUN 11 8 - 23 mg/dL   Creatinine, Ser 1.610.78 0.61 - 1.24 mg/dL   Calcium 9.3 8.9 - 09.610.3 mg/dL   GFR calc non Af Amer >60 >60 mL/min   GFR calc Af Amer >60 >60 mL/min   Anion gap 12 5 - 15  CBC     Status: Abnormal   Collection Time: 09/30/18 12:35 PM  Result Value Ref Range   WBC 6.5 4.0 - 10.5 K/uL   RBC 3.91 (L) 4.22 - 5.81 MIL/uL   Hemoglobin 12.3 (L) 13.0 - 17.0 g/dL   HCT 04.534.2 (L) 40.939.0 - 81.152.0 %   MCV 87.5 80.0 - 100.0 fL   MCH 31.5 26.0 - 34.0 pg   MCHC 36.0 30.0 - 36.0 g/dL   RDW 91.412.2 78.211.5 - 95.615.5 %   Platelets 170 150 - 400 K/uL   nRBC 0.0 0.0 - 0.2 %  Hepatic function panel     Status: None   Collection Time: 09/30/18 12:35 PM  Result Value Ref Range   Total Protein 6.7 6.5 - 8.1 g/dL   Albumin 4.5 3.5 - 5.0 g/dL   AST 25 15 - 41 U/L   ALT 21 0 - 44 U/L   Alkaline Phosphatase 53 38 - 126 U/L   Total Bilirubin 0.8 0.3 - 1.2 mg/dL   Bilirubin, Direct 0.2 0.0 - 0.2 mg/dL   Indirect Bilirubin 0.6 0.3 - 0.9 mg/dL  SARS Coronavirus 2 (CEPHEID- Performed [facility] lab), Hosp Order     Status: None   Collection Time: 09/30/18 12:54 PM   Specimen: Nasopharyngeal Swab  Result Value Ref Range   SARS Coronavirus 2 NEGATIVE NEGATIVE   ____________________________________________  EKG My review and personal interpretation at Time: 12:53   Indication: weakness  Rate: 50  Rhythm: sinus Axis: normal Other: normal, normal intervals, no stemi ____________________________________________  RADIOLOGY  I personally reviewed all radiographic images ordered to evaluate for the above acute complaints and reviewed radiology reports and findings.  These findings were personally discussed with the  patient.  Please see medical record for radiology report.  ____________________________________________   PROCEDURES  Procedure(s) performed:  .Critical Care Performed by: Willy Eddyobinson, Jin Capote, MD Authorized by: Willy Eddyobinson, Shatera Rennert, MD   Critical care provider statement:    Critical care time (minutes):  30   Critical care was necessary to treat or prevent imminent or life-threatening deterioration of the following conditions:  Metabolic crisis   Critical care was time spent personally by me on the following activities:  Discussions with consultants, evaluation of patient's response to treatment, examination of patient, ordering and performing treatments and interventions, ordering and review of laboratory studies, ordering and review  of radiographic studies, pulse oximetry, re-evaluation of patient's condition, obtaining history from patient or surrogate and review of old charts      Critical Care performed: yes  ____________________________________________   INITIAL IMPRESSION / ASSESSMENT AND PLAN / ED COURSE  Pertinent labs & imaging results that were available during my care of the patient were reviewed by me and considered in my medical decision making (see chart for details).   DDX: eelctrolyte abn, dehydration, sdh, sah, mass  Crista CurbJesse J Mayabb is a 84 y.o. who presents to the ED with his frequent falls and evidence of decreasing sodium now acutely low to 120.  CT imaging ordered to evaluate for head trauma shows no acute intracranial abnormality.  On his knees have evidence of a lung mass or pneumonia.  Do suspect some component of dehydration worsened by HCTZ which was stopped yesterday but given his weakness frequent falls I do believe patient will require inpatient mission for medical management of his acute hyponatremia.  Have started IVF NS.Have discussed with the patient and available family all diagnostics and treatments performed thus far and all questions were answered to the  best of my ability. The patient demonstrates understanding and agreement with plan.      The patient was evaluated in Emergency Department today for the symptoms described in the history of present illness. He/she was evaluated in the context of the global COVID-19 pandemic, which necessitated consideration that the patient might be at risk for infection with the SARS-CoV-2 virus that causes COVID-19. Institutional protocols and algorithms that pertain to the evaluation of patients at risk for COVID-19 are in a state of rapid change based on information released by regulatory bodies including the CDC and federal and state organizations. These policies and algorithms were followed during the patient's care in the ED.  As part of my medical decision making, I reviewed the following data within the electronic MEDICAL RECORD NUMBER Nursing notes reviewed and incorporated, Labs reviewed, notes from prior ED visits and [place] Controlled Substance Database   ____________________________________________   FINAL CLINICAL IMPRESSION(S) / ED DIAGNOSES  Final diagnoses:  Hyponatremia  Weakness  Frequent falls      NEW MEDICATIONS STARTED DURING THIS VISIT:  New Prescriptions   No medications on file     Note:  This document was prepared using Dragon voice recognition software and may include unintentional dictation errors.    Willy Eddyobinson, Nickayla Mcinnis, MD 09/30/18 [id number]

## 2018-09-30 NOTE — ED Notes (Signed)
ED TO INPATIENT HANDOFF REPORT  ED Nurse Name and Phone #: Jeannett SeniorStephen [phone]  S Name/Age/Gender Alejandro CurbJesse J Sheppard 84 y.o. male Room/Bed: ED19A/ED19A  Code Status   Code Status: Full Code  Home/SNF/Other Home Patient oriented to: self, place, time and situation Is this baseline? Yes   Triage Complete: Triage complete  Chief Complaint Abnormal labs   Triage Note FIRST NURSE NOTE-here for NA 120. Pt ambulatory. NAD  Pt here from PCP office due to "critically low" sodium level. Pt A&O, wife has gone home, no hx of dementia.    Allergies Allergies  Allergen Reactions  . Penicillins Rash    Level of Care/Admitting Diagnosis ED Disposition    ED Disposition Condition Comment   Admit  Hospital Area: [facility] [[id number]]  Level of Care: Med-Surg [16]  Covid Evaluation: Confirmed COVID Negative  Diagnosis: Hyponatremia [562130][[id number]]  Admitting Physician: Alford HighlandWIETING, RICHARD [[id number]]  Attending Physician: Alford HighlandWIETING, RICHARD [phone][[id number]]  Estimated length of stay: past midnight tomorrow  Certification:: I certify this patient will need inpatient services for at least 2 midnights  PT Class (Do Not Modify): Inpatient [101]  PT Acc Code (Do Not Modify): Private [1]       B Medical/Surgery History Past Medical History:  Diagnosis Date  . Hypertension   . Hyponatremia    Past Surgical History:  Procedure Laterality Date  . DG ANKLE LEFT COMPLETE ([facility] HX)    . SPINAL FUSION       A IV Location/Drains/Wounds Patient Lines/Drains/Airways Status   Active Line/Drains/Airways    Name:   Placement date:   Placement time:   Site:   Days:   Peripheral IV 09/30/18 Left Forearm   09/30/18    1244    Forearm   less than 1          Intake/Output Last 24 hours  Intake/Output Summary (Last 24 hours) at 09/30/2018 1630 Last data filed at 09/30/2018 1509 Gross per 24 hour  Intake 500 ml  Output -  Net 500 ml    Labs/Imaging Results for orders placed or performed during  the hospital encounter of 09/30/18 (from the past 48 hour(s))  Basic metabolic panel     Status: Abnormal   Collection Time: 09/30/18 12:35 PM  Result Value Ref Range   Sodium 122 (L) 135 - 145 mmol/L   Potassium 3.9 3.5 - 5.1 mmol/L   Chloride 87 (L) 98 - 111 mmol/L   CO2 23 22 - 32 mmol/L   Glucose, Bld 109 (H) 70 - 99 mg/dL   BUN 11 8 - 23 mg/dL   Creatinine, Ser 2.840.78 0.61 - 1.24 mg/dL   Calcium 9.3 8.9 - 13.210.3 mg/dL   GFR calc non Af Amer >60 >60 mL/min   GFR calc Af Amer >60 >60 mL/min   Anion gap 12 5 - 15    Comment: Performed at [facility], [address] [place], [place] [address]  CBC     Status: Abnormal   Collection Time: 09/30/18 12:35 PM  Result Value Ref Range   WBC 6.5 4.0 - 10.5 K/uL   RBC 3.91 (L) 4.22 - 5.81 MIL/uL   Hemoglobin 12.3 (L) 13.0 - 17.0 g/dL   HCT 27.234.2 (L) 53.639.0 - 64.452.0 %   MCV 87.5 80.0 - 100.0 fL   MCH 31.5 26.0 - 34.0 pg   MCHC 36.0 30.0 - 36.0 g/dL   RDW 03.412.2 74.211.5 - 59.515.5 %   Platelets 170 150 - 400 K/uL  nRBC 0.0 0.0 - 0.2 %    Comment: Performed at [facility], [address] [place], [place] [address]  Hepatic function panel     Status: None   Collection Time: 09/30/18 12:35 PM  Result Value Ref Range   Total Protein 6.7 6.5 - 8.1 g/dL   Albumin 4.5 3.5 - 5.0 g/dL   AST 25 15 - 41 U/L   ALT 21 0 - 44 U/L   Alkaline Phosphatase 53 38 - 126 U/L   Total Bilirubin 0.8 0.3 - 1.2 mg/dL   Bilirubin, Direct 0.2 0.0 - 0.2 mg/dL   Indirect Bilirubin 0.6 0.3 - 0.9 mg/dL    Comment: Performed at [facility], [address] [place], [place] [address]  SARS Coronavirus 2 (CEPHEID- Performed in [facility] hospital lab), Hosp Order     Status: None   Collection Time: 09/30/18 12:54 PM   Specimen: Nasopharyngeal Swab  Result Value Ref Range   SARS Coronavirus 2 NEGATIVE NEGATIVE    Comment: (NOTE) If result is NEGATIVE SARS-CoV-2 target nucleic acids are NOT DETECTED. The SARS-CoV-2 RNA is generally detectable  in upper and lower  respiratory specimens during the acute phase of infection. The lowest  concentration of SARS-CoV-2 viral copies this assay can detect is 250  copies / mL. A negative result does not preclude SARS-CoV-2 infection  and should not be used as the sole basis for treatment or other  patient management decisions.  A negative result may occur with  improper specimen collection / handling, submission of specimen other  than nasopharyngeal swab, presence of viral mutation(s) within the  areas targeted by this assay, and inadequate number of viral copies  (<250 copies / mL). A negative result must be combined with clinical  observations, patient history, and epidemiological information. If result is POSITIVE SARS-CoV-2 target nucleic acids are DETECTED. The SARS-CoV-2 RNA is generally detectable in upper and lower  respiratory specimens dur ing the acute phase of infection.  Positive  results are indicative of active infection with SARS-CoV-2.  Clinical  correlation with patient history and other diagnostic information is  necessary to determine patient infection status.  Positive results do  not rule out bacterial infection or co-infection with other viruses. If result is PRESUMPTIVE POSTIVE SARS-CoV-2 nucleic acids MAY BE PRESENT.   A presumptive positive result was obtained on the submitted specimen  and confirmed on repeat testing.  While 2019 novel coronavirus  (SARS-CoV-2) nucleic acids may be present in the submitted sample  additional confirmatory testing may be necessary for epidemiological  and / or clinical management purposes  to differentiate between  SARS-CoV-2 and other Sarbecovirus currently known to infect humans.  If clinically indicated additional testing with an alternate test  methodology [phone]([id number]) is advised. The SARS-CoV-2 RNA is generally  detectable in upper and lower respiratory sp ecimens during the acute  phase of infection. The expected result is  Negative. Fact Sheet for Patients:  BoilerBrush.com.cyhttps://www.fda.gov/media/136312/download Fact Sheet for Healthcare Providers: https://pope.com/https://www.fda.gov/media/136313/download This test is not yet approved or cleared by the [place] FDA and has been authorized for detection and/or diagnosis of SARS-CoV-2 by FDA under an Emergency Use Authorization (EUA).  This EUA will remain in effect (meaning this test can be used) for the duration of the COVID-19 declaration under Section 564(b)(1) of the Act, 21 U.S.C. section 360bbb-3(b)(1), unless the authorization is terminated or revoked sooner. Performed at [facility], [address] [place], [place] [address]    Ct  Head Wo Contrast  Result Date: 09/30/2018 CLINICAL DATA:  Facial swelling and neck pain after fall 10 days ago. EXAM: CT HEAD WITHOUT CONTRAST CT MAXILLOFACIAL WITHOUT CONTRAST CT CERVICAL SPINE WITHOUT CONTRAST TECHNIQUE: Multidetector CT imaging of the head, cervical spine, and maxillofacial structures were performed using the standard protocol without intravenous contrast. Multiplanar CT image reconstructions of the cervical spine and maxillofacial structures were also generated. COMPARISON:  None. FINDINGS: CT HEAD FINDINGS Brain: Mild chronic ischemic white matter disease is noted. No mass effect or midline shift is noted. Ventricular size is within normal limits. There is no evidence of mass lesion, hemorrhage or acute infarction. Vascular: No hyperdense vessel or unexpected calcification. Skull: Normal. Negative for fracture or focal lesion. Other: None. CT MAXILLOFACIAL FINDINGS Osseous: No fracture or mandibular dislocation. No destructive process. Orbits: Negative. No traumatic or inflammatory finding. Sinuses: Clear. Soft tissues: Negative. CT CERVICAL SPINE FINDINGS Alignment: Grade 1 retrolisthesis of C3-4 is noted secondary to severe degenerative disc disease at this level. Skull base and vertebrae: No acute fracture. No primary  bone lesion or focal pathologic process. Soft tissues and spinal canal: No prevertebral fluid or swelling. No visible canal hematoma. Disc levels: There is fusion of the C4-5, C5-6 and C6-7 disc spaces, most likely due to degenerative change. Severe degenerative disc disease is also noted at C7-T1. Upper chest: Negative. Other: Degenerative changes are seen involving posterior facet joints bilaterally. IMPRESSION: Mild chronic ischemic white matter disease. No acute intracranial abnormality seen. No abnormality seen in maxillofacial region. Extensive multilevel degenerative disc disease is noted. No acute abnormality seen in the cervical spine. Electronically Signed   By: Lupita Raider M.D.   On: 09/30/2018 14:12   Ct Cervical Spine Wo Contrast  Result Date: 09/30/2018 CLINICAL DATA:  Facial swelling and neck pain after fall 10 days ago. EXAM: CT HEAD WITHOUT CONTRAST CT MAXILLOFACIAL WITHOUT CONTRAST CT CERVICAL SPINE WITHOUT CONTRAST TECHNIQUE: Multidetector CT imaging of the head, cervical spine, and maxillofacial structures were performed using the standard protocol without intravenous contrast. Multiplanar CT image reconstructions of the cervical spine and maxillofacial structures were also generated. COMPARISON:  None. FINDINGS: CT HEAD FINDINGS Brain: Mild chronic ischemic white matter disease is noted. No mass effect or midline shift is noted. Ventricular size is within normal limits. There is no evidence of mass lesion, hemorrhage or acute infarction. Vascular: No hyperdense vessel or unexpected calcification. Skull: Normal. Negative for fracture or focal lesion. Other: None. CT MAXILLOFACIAL FINDINGS Osseous: No fracture or mandibular dislocation. No destructive process. Orbits: Negative. No traumatic or inflammatory finding. Sinuses: Clear. Soft tissues: Negative. CT CERVICAL SPINE FINDINGS Alignment: Grade 1 retrolisthesis of C3-4 is noted secondary to severe degenerative disc disease at this  level. Skull base and vertebrae: No acute fracture. No primary bone lesion or focal pathologic process. Soft tissues and spinal canal: No prevertebral fluid or swelling. No visible canal hematoma. Disc levels: There is fusion of the C4-5, C5-6 and C6-7 disc spaces, most likely due to degenerative change. Severe degenerative disc disease is also noted at C7-T1. Upper chest: Negative. Other: Degenerative changes are seen involving posterior facet joints bilaterally. IMPRESSION: Mild chronic ischemic white matter disease. No acute intracranial abnormality seen. No abnormality seen in maxillofacial region. Extensive multilevel degenerative disc disease is noted. No acute abnormality seen in the cervical spine. Electronically Signed   By: Lupita Raider M.D.   On: 09/30/2018 14:12   Dg Chest Portable 1 View  Result Date: 09/30/2018 CLINICAL DATA:  Hyponatremia.  Weakness. EXAM: PORTABLE CHEST 1 VIEW COMPARISON:  March 14, 2017 FINDINGS: There is no edema or consolidation. The heart size and pulmonary vascularity are normal. No adenopathy. There is aortic atherosclerosis. There is degenerative change in each shoulder. There is colonic interposition between the right hemidiaphragm and liver. IMPRESSION: No edema or consolidation. Stable cardiac silhouette. Aortic Atherosclerosis (ICD10-I70.0). Electronically Signed   By: Lowella Grip III M.D.   On: 09/30/2018 13:22   Ct Maxillofacial Wo Contrast  Result Date: 09/30/2018 CLINICAL DATA:  Facial swelling and neck pain after fall 10 days ago. EXAM: CT HEAD WITHOUT CONTRAST CT MAXILLOFACIAL WITHOUT CONTRAST CT CERVICAL SPINE WITHOUT CONTRAST TECHNIQUE: Multidetector CT imaging of the head, cervical spine, and maxillofacial structures were performed using the standard protocol without intravenous contrast. Multiplanar CT image reconstructions of the cervical spine and maxillofacial structures were also generated. COMPARISON:  None. FINDINGS: CT HEAD FINDINGS  Brain: Mild chronic ischemic white matter disease is noted. No mass effect or midline shift is noted. Ventricular size is within normal limits. There is no evidence of mass lesion, hemorrhage or acute infarction. Vascular: No hyperdense vessel or unexpected calcification. Skull: Normal. Negative for fracture or focal lesion. Other: None. CT MAXILLOFACIAL FINDINGS Osseous: No fracture or mandibular dislocation. No destructive process. Orbits: Negative. No traumatic or inflammatory finding. Sinuses: Clear. Soft tissues: Negative. CT CERVICAL SPINE FINDINGS Alignment: Grade 1 retrolisthesis of C3-4 is noted secondary to severe degenerative disc disease at this level. Skull base and vertebrae: No acute fracture. No primary bone lesion or focal pathologic process. Soft tissues and spinal canal: No prevertebral fluid or swelling. No visible canal hematoma. Disc levels: There is fusion of the C4-5, C5-6 and C6-7 disc spaces, most likely due to degenerative change. Severe degenerative disc disease is also noted at C7-T1. Upper chest: Negative. Other: Degenerative changes are seen involving posterior facet joints bilaterally. IMPRESSION: Mild chronic ischemic white matter disease. No acute intracranial abnormality seen. No abnormality seen in maxillofacial region. Extensive multilevel degenerative disc disease is noted. No acute abnormality seen in the cervical spine. Electronically Signed   By: Marijo Conception M.D.   On: 09/30/2018 14:12    Pending Labs Unresulted Labs (From admission, onward)    Start     Ordered   10/07/18 0500  Creatinine, serum  (enoxaparin (LOVENOX)    CrCl >/= 30 ml/min)  Weekly,   STAT    Comments: while on enoxaparin therapy    09/30/18 1530   10/01/18 0981  Basic metabolic panel  Tomorrow morning,   STAT     09/30/18 1530   10/01/18 0500  CBC  Tomorrow morning,   STAT     09/30/18 1530   09/30/18 1546  Sodium, urine, random  Once,   STAT     09/30/18 1545   09/30/18 1546   Osmolality, urine  Once,   STAT     09/30/18 1545          Vitals/Pain Today's Vitals   09/30/18 1400 09/30/18 1430 09/30/18 1500 09/30/18 1505  BP: (!) 152/61   (!) 151/57  Pulse: (!) 51 (!) 55 (!) 54 (!) 53  Resp:   10 18  Temp:      TempSrc:      SpO2: 100% 100% 99% 100%  PainSc:        Isolation Precautions No active isolations  Medications Medications  enoxaparin (LOVENOX) injection 40 mg (has no administration in time range)  acetaminophen (TYLENOL) tablet 650 mg (  has no administration in time range)    Or  acetaminophen (TYLENOL) suppository 650 mg (has no administration in time range)  ondansetron (ZOFRAN) tablet 4 mg (has no administration in time range)    Or  ondansetron (ZOFRAN) injection 4 mg (has no administration in time range)  0.9 %  sodium chloride infusion (has no administration in time range)  benazepril (LOTENSIN) tablet 20 mg (has no administration in time range)  metoprolol tartrate (LOPRESSOR) tablet 25 mg (has no administration in time range)  atorvastatin (LIPITOR) tablet 40 mg (has no administration in time range)  amLODipine (NORVASC) tablet 10 mg (has no administration in time range)  citalopram (CELEXA) tablet 10 mg (has no administration in time range)  aspirin EC tablet 81 mg (has no administration in time range)  traZODone (DESYREL) tablet 50 mg (has no administration in time range)  sodium chloride 0.9 % bolus 500 mL (0 mLs Intravenous Stopped 09/30/18 1509)    Mobility walks High fall risk   Focused Assessments Cardiac Assessment Handoff:    Lab Results  Component Value Date   TROPONINI <0.03 03/06/2016   No results found for: DDIMER Does the Patient currently have chest pain? No     R Recommendations: See Admitting Provider Note  Report given to:   Additional Notes:

## 2018-09-30 NOTE — ED Notes (Signed)
Patient transported to CT 

## 2018-09-30 NOTE — Plan of Care (Signed)
Pt admitted from the ED. VSS. Denies pain. A&Ox4. IVF initiated.

## 2018-09-30 NOTE — ED Triage Notes (Signed)
FIRST NURSE NOTE-here for NA 120. Pt ambulatory. NAD

## 2018-09-30 NOTE — ED Triage Notes (Signed)
Pt here from PCP office due to "critically low" sodium level. Pt A&O, wife has gone home, no hx of dementia.

## 2018-09-30 NOTE — Plan of Care (Signed)
New admission - establishing care plan

## 2018-10-01 DIAGNOSIS — E871 Hypo-osmolality and hyponatremia: Secondary | ICD-10-CM | POA: Diagnosis not present

## 2018-10-01 LAB — BASIC METABOLIC PANEL
Anion gap: 7 (ref 5–15)
BUN: 9 mg/dL (ref 8–23)
CO2: 26 mmol/L (ref 22–32)
Calcium: 9.3 mg/dL (ref 8.9–10.3)
Chloride: 99 mmol/L (ref 98–111)
Creatinine, Ser: 0.86 mg/dL (ref 0.61–1.24)
GFR calc Af Amer: >60 mL/min (ref >60)
GFR calc non Af Amer: >60 mL/min (ref >60)
Glucose, Bld: 95 mg/dL (ref 70–99)
Potassium: 4.4 mmol/L (ref 3.5–5.1)
Sodium: 132 mmol/L — ABNORMAL LOW (ref 135–145)

## 2018-10-01 LAB — CBC
HCT: 34 % — ABNORMAL LOW (ref 39.0–52.0)
Hemoglobin: 12 g/dL — ABNORMAL LOW (ref 13.0–17.0)
MCH: 31.9 pg (ref 26.0–34.0)
MCHC: 35.3 g/dL (ref 30.0–36.0)
MCV: 90.4 fL (ref 80.0–100.0)
Platelets: 158 10*3/uL (ref 150–400)
RBC: 3.76 MIL/uL — ABNORMAL LOW (ref 4.22–5.81)
RDW: 12.5 % (ref 11.5–15.5)
WBC: 5.4 10*3/uL (ref 4.0–10.5)
nRBC: 0 % (ref 0.0–0.2)

## 2018-10-01 NOTE — Progress Notes (Signed)
Patient discharged home per MD order. All discharge instructions given and all questions answered. 

## 2018-10-01 NOTE — Discharge Instructions (Signed)
Resume diet and activity as before ° ° °

## 2018-10-04 ENCOUNTER — Ambulatory Visit
Admission: RE | Admit: 2018-10-04 | Discharge: 2018-10-04 | Disposition: A | Discharge status: Home / Self Care | Payer: Medicare Other | Source: Ambulatory Visit | Attending: Student | Admitting: Student

## 2018-10-04 ENCOUNTER — Other Ambulatory Visit: Payer: Self-pay

## 2018-10-04 DIAGNOSIS — R05 Cough: Secondary | ICD-10-CM | POA: Diagnosis present

## 2018-10-04 DIAGNOSIS — R1310 Dysphagia, unspecified: Secondary | ICD-10-CM | POA: Diagnosis present

## 2018-10-04 DIAGNOSIS — R053 Chronic cough: Secondary | ICD-10-CM

## 2018-10-04 NOTE — Discharge Summary (Signed)
[facility] [place] NAME: Alejandro Sheppard    MR#:  [id number]  DATE OF BIRTH:  12-23-1934  DATE OF ADMISSION:  09/30/2018 ADMITTING PHYSICIAN: Loletha Grayer, MD  DATE OF DISCHARGE: 10/01/2018  2:26 PM  PRIMARY CARE PHYSICIAN: [place] Pink, MD   ADMISSION DIAGNOSIS:  Hyponatremia [E87.1] Weakness [R53.1] Frequent falls [R29.6]  DISCHARGE DIAGNOSIS:  Active Problems:   Hyponatremia   SECONDARY DIAGNOSIS:   Past Medical History:  Diagnosis Date  . Hypertension   . Hyponatremia      ADMITTING HISTORY  HISTORY OF PRESENT ILLNESS:  Alejandro Sheppard  is a 84 y.o. male with a known history of hypertension and hyponatremia presents to the hospital after being called by his doctor to come in because of low sodium.  The patient states he takes hydrochlorothiazide at home.  His doctor called him yesterday because of the low sodium and told him to stop the hydrochlorothiazide and drink some water.  They repeated blood test today and sodium was still low and they referred him to the ER.  In the ER his sodium is 122.  Hospitalist services contacted for further evaluation.  Of note the patient did have a fall about 10 days ago and bruising bilateral face and around the eyes.  I asked him where his hands were when he fell.  The patient stated that he could not get his hands up in time.  HOSPITAL COURSE:   *Severe hyponatremia secondary to dehydration and being on hydrochlorothiazide.  Patient was started on IV fluids.  Hydrochlorothiazide stopped.  Sodium improved well to 132.  His symptoms improved.  Patient ambulated prior to discharge tolerating diet and will be discharged home.  Discontinued hydrochlorothiazide.  *Hypertension.  Continued on Norvasc, benazepril and metoprolol.  Discontinued hydrochlorothiazide.  Stable.  *Hyperlipidemia, depression stable.  Patient discharged home in stable condition to follow-up with primary care physician within a  week for repeat sodium check and blood pressure check.  CONSULTS OBTAINED:    DRUG ALLERGIES:   Allergies  Allergen Reactions  . Penicillins Rash    DISCHARGE MEDICATIONS:   Allergies as of 10/01/2018      Reactions   Penicillins Rash      Medication List    TAKE these medications   amLODipine 10 MG tablet Commonly known as: NORVASC Take 10 mg by mouth daily.   Artificial Tears 1.4 % ophthalmic solution Generic drug: polyvinyl alcohol Apply 1-2 drops to eye daily as needed.   aspirin EC 81 MG tablet Take 81 mg by mouth at bedtime.   atorvastatin 40 MG tablet Commonly known as: LIPITOR Take 40 mg by mouth every evening.   benazepril 40 MG tablet Commonly known as: LOTENSIN Take 40 mg by mouth daily.   citalopram 20 MG tablet Commonly known as: CELEXA Take 30 mg by mouth daily.   metoprolol tartrate 50 MG tablet Commonly known as: LOPRESSOR Take 50 mg by mouth 2 (two) times a day.   traZODone 50 MG tablet Commonly known as: DESYREL Take 250 mg by mouth at bedtime.       Today   VITAL SIGNS:  Blood pressure (!) 162/49, pulse (!) 57, temperature 97.6 F (36.4 C), temperature source Oral, resp. rate 18, height 5\' 10"  (1.778 m), weight 73.7 kg, SpO2 99 %.  I/O:  No intake or output data in the 24 hours ending 10/04/18 1645  PHYSICAL EXAMINATION:  Physical Exam  GENERAL:  84 y.o.-year-old patient lying in  the bed with no acute distress.  LUNGS: Normal breath sounds bilaterally, no wheezing, rales,rhonchi or crepitation. No use of accessory muscles of respiration.  CARDIOVASCULAR: S1, S2 normal. No murmurs, rubs, or gallops.  ABDOMEN: Soft, non-tender, non-distended. Bowel sounds present. No organomegaly or mass.  NEUROLOGIC: Moves all 4 extremities. PSYCHIATRIC: The patient is alert and oriented x 3.  SKIN: No obvious rash, lesion, or ulcer.   DATA REVIEW:   CBC Recent Labs  Lab 10/01/18 0513  WBC 5.4  HGB 12.0*  HCT 34.0*  PLT 158     Chemistries  Recent Labs  Lab 09/30/18 1235 10/01/18 0513  NA 122* 132*  K 3.9 4.4  CL 87* 99  CO2 23 26  GLUCOSE 109* 95  BUN 11 9  CREATININE 0.78 0.86  CALCIUM 9.3 9.3  AST 25  --   ALT 21  --   ALKPHOS 53  --   BILITOT 0.8  --     Cardiac Enzymes No results for input(s): TROPONINI in the last 168 hours.  Microbiology Results  Results for orders placed or performed during the hospital encounter of 09/30/18  SARS Coronavirus 2 (CEPHEID- Performed in [facility] hospital lab), Hosp Order     Status: None   Collection Time: 09/30/18 12:54 PM   Specimen: Nasopharyngeal Swab  Result Value Ref Range Status   SARS Coronavirus 2 NEGATIVE NEGATIVE Final    Comment: (NOTE) If result is NEGATIVE SARS-CoV-2 target nucleic acids are NOT DETECTED. The SARS-CoV-2 RNA is generally detectable in upper and lower  respiratory specimens during the acute phase of infection. The lowest  concentration of SARS-CoV-2 viral copies this assay can detect is 250  copies / mL. A negative result does not preclude SARS-CoV-2 infection  and should not be used as the sole basis for treatment or other  patient management decisions.  A negative result may occur with  improper specimen collection / handling, submission of specimen other  than nasopharyngeal swab, presence of viral mutation(s) within the  areas targeted by this assay, and inadequate number of viral copies  (<250 copies / mL). A negative result must be combined with clinical  observations, patient history, and epidemiological information. If result is POSITIVE SARS-CoV-2 target nucleic acids are DETECTED. The SARS-CoV-2 RNA is generally detectable in upper and lower  respiratory specimens dur ing the acute phase of infection.  Positive  results are indicative of active infection with SARS-CoV-2.  Clinical  correlation with patient history and other diagnostic information is  necessary to determine patient infection status.   Positive results do  not rule out bacterial infection or co-infection with other viruses. If result is PRESUMPTIVE POSTIVE SARS-CoV-2 nucleic acids MAY BE PRESENT.   A presumptive positive result was obtained on the submitted specimen  and confirmed on repeat testing.  While 2019 novel coronavirus  (SARS-CoV-2) nucleic acids may be present in the submitted sample  additional confirmatory testing may be necessary for epidemiological  and / or clinical management purposes  to differentiate between  SARS-CoV-2 and other Sarbecovirus currently known to infect humans.  If clinically indicated additional testing with an alternate test  methodology [phone]) is advised. The SARS-CoV-2 RNA is generally  detectable in upper and lower respiratory sp ecimens during the acute  phase of infection. The expected result is Negative. Fact Sheet for Patients:  BoilerBrush.com.cyhttps://www.fda.gov/media/136312/download Fact Sheet for Healthcare Providers: https://pope.com/https://www.fda.gov/media/136313/download This test is not yet approved or cleared by the [place] FDA and has been authorized for  detection and/or diagnosis of SARS-CoV-2 by FDA under an Emergency Use Authorization (EUA).  This EUA will remain in effect (meaning this test can be used) for the duration of the COVID-19 declaration under Section 564(b)(1) of the Act, 21 U.S.C. section 360bbb-3(b)(1), unless the authorization is terminated or revoked sooner. Performed at [facility], [address] [place], [place] [address]     RADIOLOGY:  Dg Esophagus W Double Cm (hd)  Result Date: 10/04/2018 CLINICAL DATA:  Dysphagia, chronic cough EXAM: ESOPHOGRAM / BARIUM SWALLOW / BARIUM TABLET STUDY TECHNIQUE: Combined double contrast and single contrast examination performed using effervescent crystals, thick barium liquid, and thin barium liquid. The patient was observed with fluoroscopy swallowing a 13 mm barium sulphate tablet. FLUOROSCOPY TIME:  Fluoroscopy  Time:  1.2 minute Radiation Exposure Index (if provided by the fluoroscopic device): 3.5 mGy Number of Acquired Spot Images: 0 COMPARISON:  None. FINDINGS: There was normal pharyngeal anatomy and motility. With thick barium there is no tracheal aspiration. With thin barium there is a single episode of tracheal aspiration with cough reflex. Contrast flowed freely through the esophagus without evidence of stricture or mass. There was normal esophageal mucosa without evidence of irregularity or ulceration. Esophageal motility was normal. No evidence of reflux. No definite hiatal hernia was demonstrated. At the end of the examination a 13 mm barium tablet was administered which transited through the esophagus and esophagogastric junction without delay. Degenerative disc disease with disc height loss at C3-4 with prominent anterior osteophyte . IMPRESSION: 1. There is a single episode of tracheal aspiration with cough reflex when ingesting thin barium. Further evaluation with speech pathology may be helpful. 2. Otherwise unremarkable barium swallow. Electronically Signed   By: Elige KoHetal  Patel   On: 10/04/2018 09:43    Follow up with PCP in 1 week.  Management plans discussed with the patient, family and they are in agreement.  CODE STATUS:  Code Status History    Date Active Date Inactive Code Status Order ID Comments User Context   09/30/2018 1530 10/01/2018 1732 Full Code [id number]  Alford HighlandWieting, Richard, MD ED   Advance Care Planning Activity    Advance Directive Documentation     Most Recent Value  Type of Advance Directive  Healthcare Power of Attorney, Living will  Pre-existing out of facility DNR order (yellow form or pink MOST form)  -  "MOST" Form in Place?  -      TOTAL TIME TAKING CARE OF THIS PATIENT ON DAY OF DISCHARGE: more than 30 minutes.   Orie FishermanSrikar R Sundi Slevin M.D on 10/04/2018 at 4:45 PM  Between 7am to 6pm - Pager - [phone]  After 6pm go to [email or web address] - password [email or web address]  [employer]  [facility]  Office  [phone]  CC: Primary care physician; Jerl MinaHedrick, James, MD  Note: This dictation was prepared with Dragon dictation along with smaller phrase technology. Any transcriptional errors that result from this process are unintentional.

## 2018-10-13 ENCOUNTER — Other Ambulatory Visit: Payer: Self-pay | Admitting: Student

## 2018-10-13 DIAGNOSIS — R05 Cough: Secondary | ICD-10-CM

## 2018-10-13 DIAGNOSIS — R053 Chronic cough: Secondary | ICD-10-CM

## 2018-10-13 DIAGNOSIS — R1310 Dysphagia, unspecified: Secondary | ICD-10-CM

## 2018-10-31 ENCOUNTER — Other Ambulatory Visit: Payer: Self-pay

## 2018-10-31 ENCOUNTER — Ambulatory Visit
Admission: RE | Admit: 2018-10-31 | Discharge: 2018-10-31 | Disposition: A | Discharge status: Home / Self Care | Payer: Medicare Other | Source: Ambulatory Visit | Attending: Student | Admitting: Student

## 2018-10-31 DIAGNOSIS — R05 Cough: Secondary | ICD-10-CM | POA: Diagnosis present

## 2018-10-31 DIAGNOSIS — R1313 Dysphagia, pharyngeal phase: Secondary | ICD-10-CM | POA: Diagnosis not present

## 2018-10-31 DIAGNOSIS — R053 Chronic cough: Secondary | ICD-10-CM

## 2018-10-31 NOTE — Therapy (Signed)
[facility] [address] [place], [place], [address] Phone: [phone]   Fax:     Modified Barium Swallow  Patient Details  Name: Alejandro Sheppard MRN: [id number] Date of Birth: 01/24/1935 No data recorded  Encounter Date: 10/31/2018  End of Session - 10/31/18 1454    Visit Number  1    Number of Visits  1    Date for SLP Re-Evaluation  10/31/18    SLP Start Time  1300    SLP Stop Time   1345    SLP Time Calculation (min)  45 min    Activity Tolerance  Patient tolerated treatment well       Past Medical History:  Diagnosis Date  . Hypertension   . Hyponatremia     Past Surgical History:  Procedure Laterality Date  . DG ANKLE LEFT COMPLETE ([facility] HX)    . SPINAL FUSION      There were no vitals filed for this visit.   Subjective: Patient behavior: (alertness, ability to follow instructions, etc.):  The patient is alert, able to verbalize his swallowing complaints, and follow directions for this study.  Chief complaint: Aspiration seen on barium swallow study, frequent coughing while eating   Objective:  Radiological Procedure: A videoflouroscopic evaluation of oral-preparatory, reflex initiation, and pharyngeal phases of the swallow was performed; as well as a screening of the upper esophageal phase.  I. POSTURE: Upright in MBS chair  II. VIEW: Lateral  III. COMPENSATORY STRATEGIES: Dry swallows aid pharyngeal clearance, liquid wash aids pharyngeal clearance  IV. BOLUSES ADMINISTERED:   Thin Liquid: 3 small   Nectar-thick Liquid: 2 moderate   Honey-thick Liquid: DNT   Puree: 2 teaspoon presentations   Mechanical Soft: 1/4 graham cracker in applesauce  V. RESULTS OF EVALUATION: A. ORAL PREPARATORY PHASE: (The lips, tongue, and velum are observed for strength and coordination)       **Overall Severity Rating: within normal limits   B. SWALLOW INITIATION/REFLEX: (The reflex is normal if "triggered"  by the time the bolus reached the base of the tongue)  **Overall Severity Rating: Moderate; triggers while falling from the valleculae to the pyriform sinuses  C. PHARYNGEAL PHASE: (Pharyngeal function is normal if the bolus shows rapid, smooth, and continuous transit through the pharynx and there is no pharyngeal residue after the swallow)  **Overall Severity Rating: Moderate; reduced hyolaryngeal excursion, reduced/absent epiglotic inversion, severe vallecular residue with solid, min-mod pharyngeal residue with liquids  D. LARYNGEAL PENETRATION: (Material entering into the laryngeal inlet/vestibule but not aspirated) None  E. ASPIRATION: None  F. ESOPHAGEAL PHASE: (Screening of the upper esophagus) Recent barium swallow study  ASSESSMENT: This 84 year old man; with aspiration seen on barium swallow study; is presenting with moderate pharyngeal dysphagia characterized by delayed pharyngeal swallow initiation, reduced/absent epiglottic inversion, reduced hyolaryngeal excursion, min-mod pharyngeal residue with liquids, and severe vallecular residue with solids.  Oral control of the bolus including oral hold, rotary mastication, and anterior to posterior transfer is within functional limits.  Poor epiglottic inversion appears to be mechanical as the epiglottis hits the posterior pharyngeal wall and cannot invert due to large vertebrae impeding full movement.  During this study, there was no observed laryngeal penetration or tracheal aspiration.  The patient is able to hawk pharyngeal residue and is sensate to aspiration per barium study last month.  The patient was advised to take small bites and sips, alternate liquids and solids, maintain stringent oral care, and to hawk/cough as needed  as these are protective maneuvers.  Because this a mechanical issue, exercises are not likely to improve pharyngeal clearance.    PLAN/RECOMMENDATIONS:  A. Diet: Regular- soften moisten as needed for easier  swallowing   B. Swallowing Precautions: small bites and sips, alternate liquids and solids, maintain stringent oral care, and to hawk/cough as needed as these are protective maneuvers.   C. Recommended consultation to: follow up with MDs as recommended   D. Therapy recommendations: speech therapy is not indicated   E. Results and recommendations were discussed with the patient immediately following the study and the final report routed to the referring provider and the patient' PCP  1. Pharyngeal dysphagia   2. Chronic cough           Problem List Patient Active Problem List   Diagnosis Date Noted  . Hyponatremia 09/30/2018   Alejandro Sea, MS/CCC- SLP  [name] 10/31/2018, 2:55 PM  [facility] [place], [place], [address] Phone: [phone]   Fax:     Name: Alejandro Sheppard MRN: [id number] Date of Birth: May 15, 1934

## 2020-07-11 IMAGING — RF ESOPHAGUS/BARIUM SWALLOW/TABLET STUDY
10 of 15 series · 14 of 24 positions shown · non-contrast
Comparison: None.

CLINICAL DATA: Dysphagia, chronic cough

EXAM:
ESOPHOGRAM / BARIUM SWALLOW / BARIUM TABLET STUDY
TECHNIQUE: Combined double contrast and single contrast examination performed
using effervescent crystals, thick barium liquid, and thin barium
liquid. The patient was observed with fluoroscopy swallowing a 13 mm
barium sulphate tablet.
FLUOROSCOPY TIME:  Fluoroscopy Time:  1.2 minute
Radiation Exposure Index (if provided by the fluoroscopic device):
3.5 mGy
Number of Acquired Spot Images: 0

[Series 1: cp_standard · 0.26mm/px · 2 of 33 frames shown (1 of 10)]
[frame 1/33]
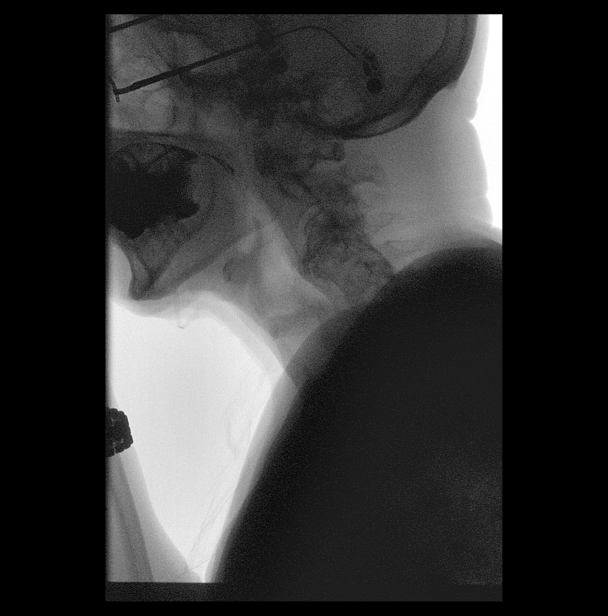
[frame 17/33]
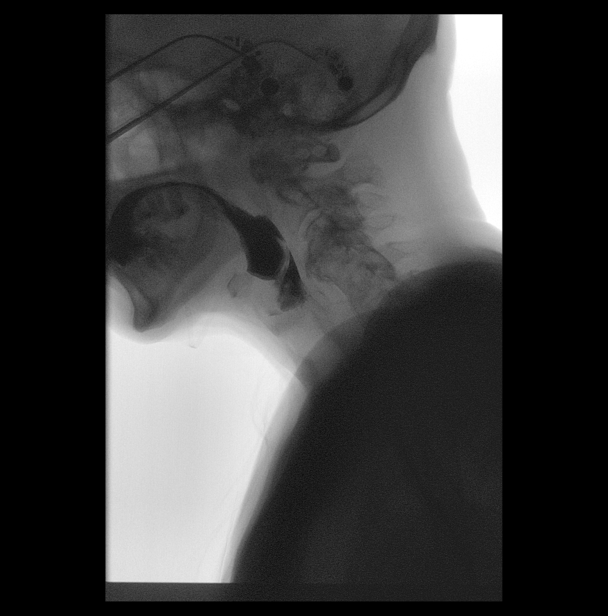

[Series 2: cp_standard · 0.26mm/px · 3 of 40 frames shown (2 of 10)]
[frame 7/40]
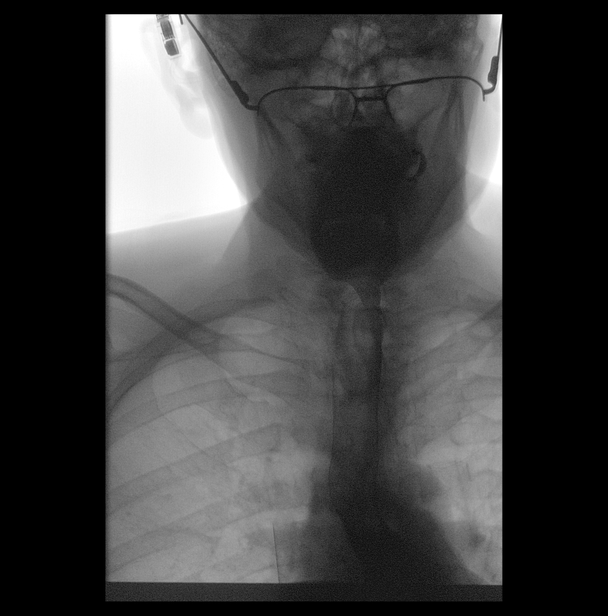
[frame 35/40]
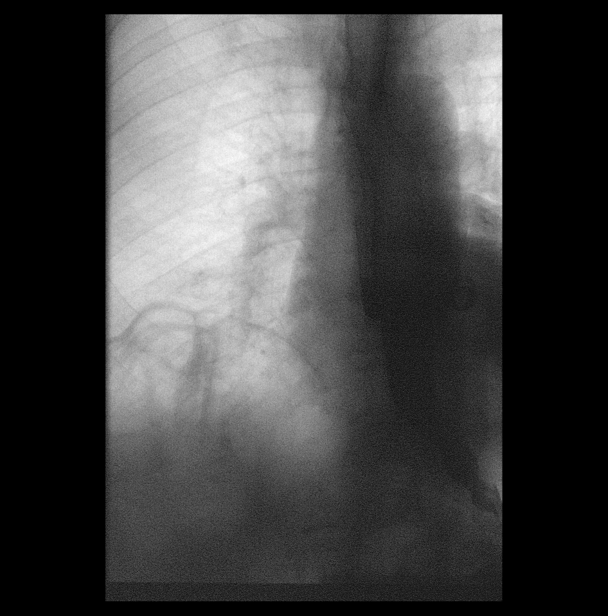
[frame 39/40]
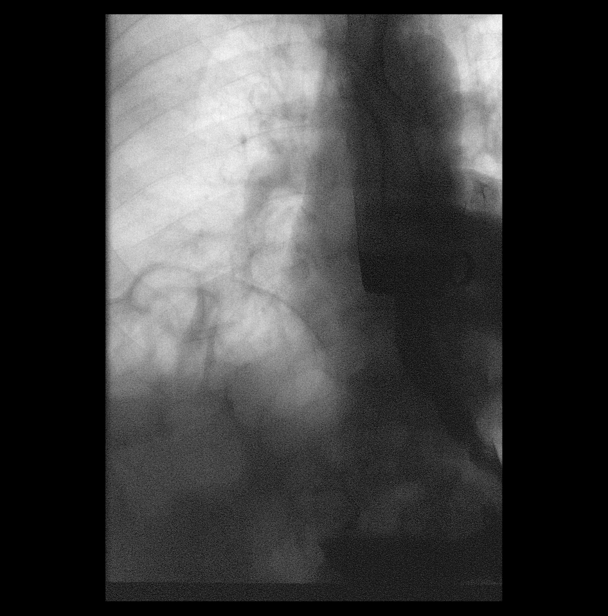

[Series 3: cp_standard · 0.26mm/px · 2 of 52 frames shown (3 of 10)]
[frame 15/52]
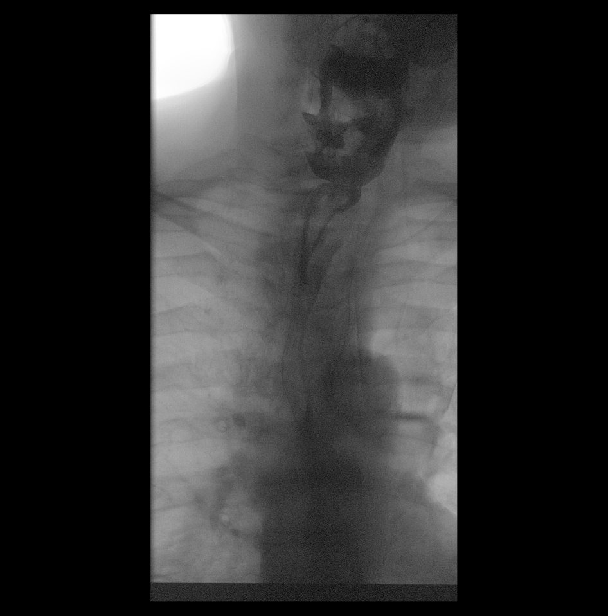
[frame 45/52]
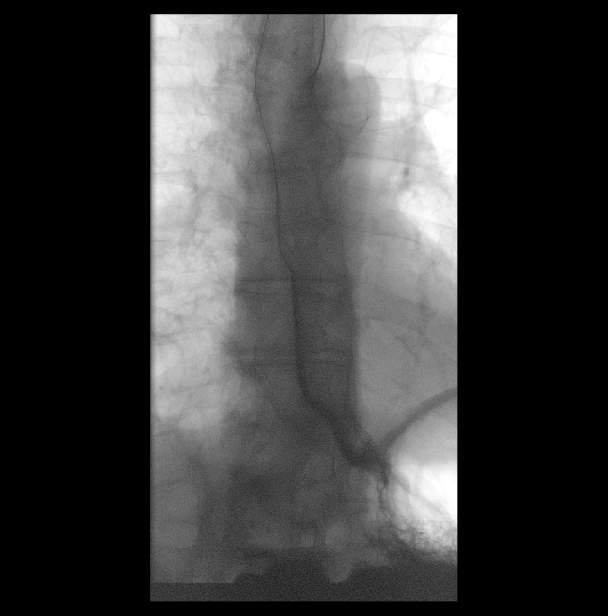

[Series 4: cp_standard · 0.26mm/px · 1 of 1 slices shown (4 of 10)]
[im 1/1]
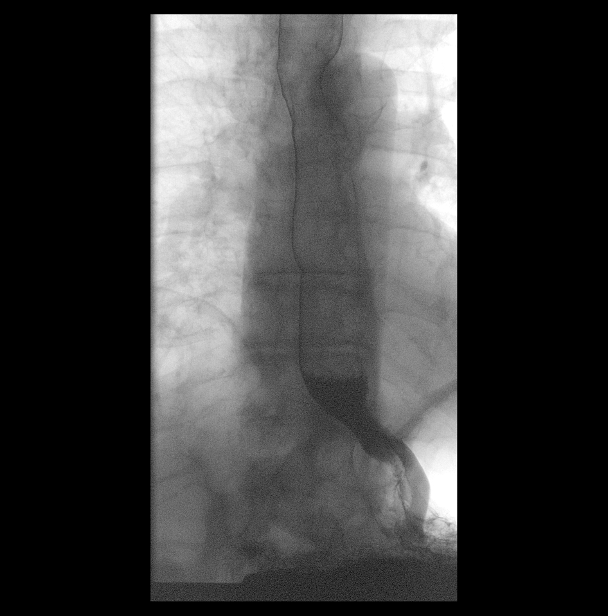

[Series 6: cp_standard · 0.28mm/px · 1 of 1 slices shown (5 of 10)]
[im 1/1]
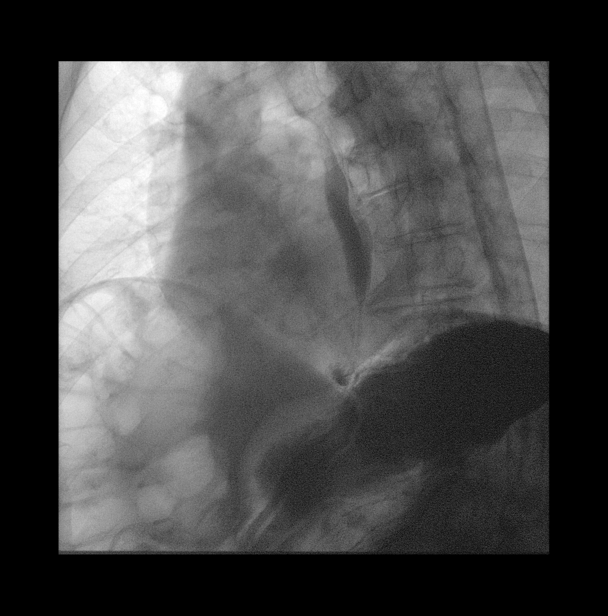

[Series 8: cp_standard · 0.28mm/px · 1 of 1 slices shown (6 of 10)]
[im 1/1]
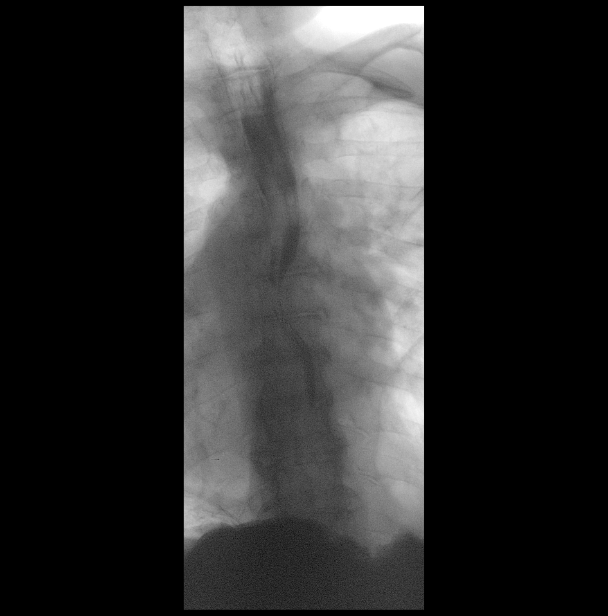

[Series 10: cp_standard · 0.28mm/px · 1 of 1 slices shown (7 of 10)]
[im 1/1]
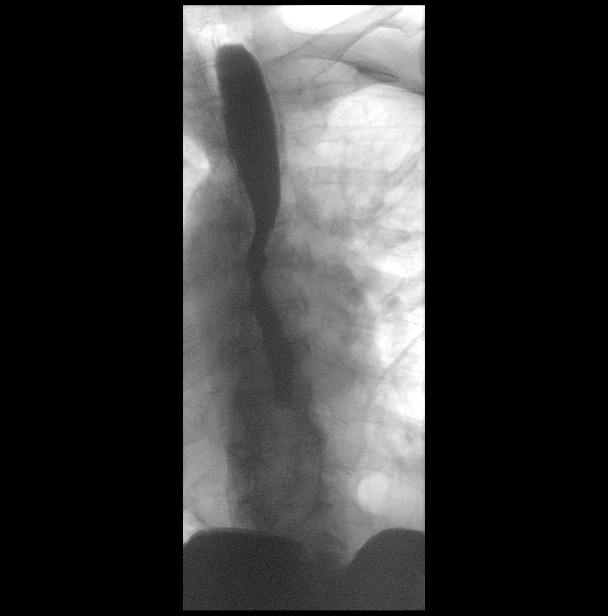

[Series 11: cp_standard · 0.28mm/px · 1 of 1 slices shown (8 of 10)]
[im 1/1]
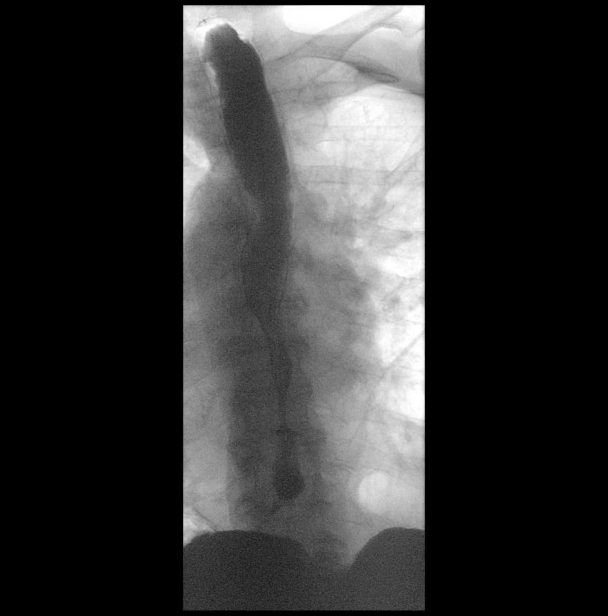

[Series 13: cp_standard · 0.28mm/px · 1 of 1 slices shown (9 of 10)]
[im 1/1]
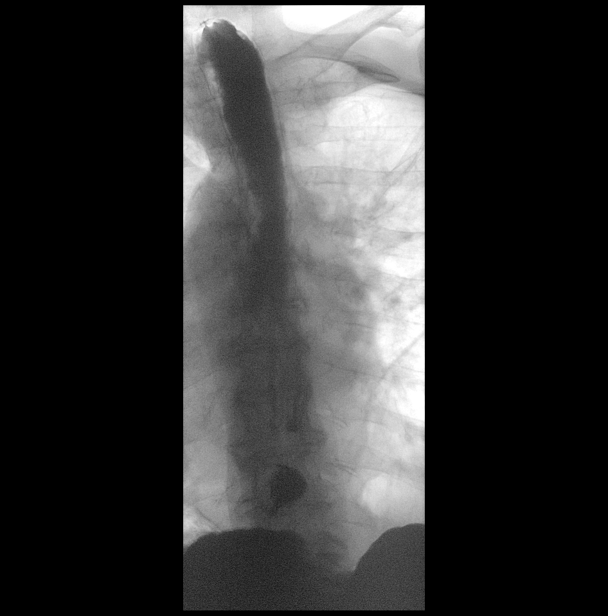

[Series 15: cp_standard · 0.25mm/px · 1 of 1 slices shown (10 of 10)]
[im 1/1]
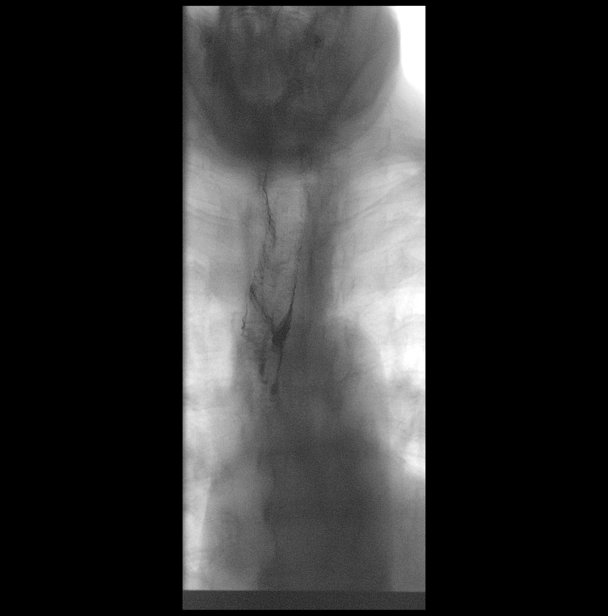

[14 of 24 positions shown; findings below may reference images not displayed]

FINDINGS: There was normal pharyngeal anatomy and motility. With thick barium
there is no tracheal aspiration. With thin barium there is a single
episode of tracheal aspiration with cough reflex. Contrast flowed
freely through the esophagus without evidence of stricture or mass.
There was normal esophageal mucosa without evidence of irregularity
or ulceration. Esophageal motility was normal. No evidence of
reflux. No definite hiatal hernia was demonstrated.

At the end of the examination a 13 mm barium tablet was administered
which transited through the esophagus and esophagogastric junction
without delay.

Degenerative disc disease with disc height loss at C3-4 with
prominent anterior osteophyte .
IMPRESSION: 1. There is a single episode of tracheal aspiration with cough
reflex when ingesting thin barium. Further evaluation with speech
pathology may be helpful.
2. Otherwise unremarkable barium swallow.

## 2021-07-02 ENCOUNTER — Other Ambulatory Visit: Payer: Self-pay | Admitting: Orthopedic Surgery

## 2021-07-02 DIAGNOSIS — M25511 Pain in right shoulder: Secondary | ICD-10-CM

## 2021-07-02 DIAGNOSIS — M12811 Other specific arthropathies, not elsewhere classified, right shoulder: Secondary | ICD-10-CM

## 2022-01-14 ENCOUNTER — Ambulatory Visit: Payer: Medicare Other

## 2023-08-09 ENCOUNTER — Observation Stay
Admission: EM | Admit: 2023-08-09 | Discharge: 2023-08-11 | Disposition: A | Discharge status: Home / Self Care | Attending: General Surgery | Admitting: General Surgery

## 2023-08-09 ENCOUNTER — Other Ambulatory Visit: Payer: Self-pay

## 2023-08-09 ENCOUNTER — Emergency Department

## 2023-08-09 DIAGNOSIS — K5730 Diverticulosis of large intestine without perforation or abscess without bleeding: Secondary | ICD-10-CM | POA: Diagnosis not present

## 2023-08-09 DIAGNOSIS — K810 Acute cholecystitis: Secondary | ICD-10-CM | POA: Diagnosis not present

## 2023-08-09 DIAGNOSIS — Z79899 Other long term (current) drug therapy: Secondary | ICD-10-CM | POA: Diagnosis not present

## 2023-08-09 DIAGNOSIS — I10 Essential (primary) hypertension: Secondary | ICD-10-CM | POA: Insufficient documentation

## 2023-08-09 DIAGNOSIS — Z7982 Long term (current) use of aspirin: Secondary | ICD-10-CM | POA: Diagnosis not present

## 2023-08-09 DIAGNOSIS — R002 Palpitations: Secondary | ICD-10-CM | POA: Insufficient documentation

## 2023-08-09 DIAGNOSIS — R079 Chest pain, unspecified: Secondary | ICD-10-CM

## 2023-08-09 LAB — CBC
HCT: 40.8 % (ref 39.0–52.0)
Hemoglobin: 13.6 g/dL (ref 13.0–17.0)
MCH: 31.3 pg (ref 26.0–34.0)
MCHC: 33.3 g/dL (ref 30.0–36.0)
MCV: 93.8 fL (ref 80.0–100.0)
Platelets: 158 10*3/uL (ref 150–400)
RBC: 4.35 MIL/uL (ref 4.22–5.81)
RDW: 11.9 % (ref 11.5–15.5)
WBC: 15.2 10*3/uL — ABNORMAL HIGH (ref 4.0–10.5)
nRBC: 0 % (ref 0.0–0.2)

## 2023-08-09 LAB — BASIC METABOLIC PANEL WITH GFR
Anion gap: 12 (ref 5–15)
BUN: 19 mg/dL (ref 8–23)
CO2: 22 mmol/L (ref 22–32)
Calcium: 8.9 mg/dL (ref 8.9–10.3)
Chloride: 96 mmol/L — ABNORMAL LOW (ref 98–111)
Creatinine, Ser: 0.77 mg/dL (ref 0.61–1.24)
GFR, Estimated: >60 mL/min (ref >60)
Glucose, Bld: 133 mg/dL — ABNORMAL HIGH (ref 70–99)
Potassium: 4.4 mmol/L (ref 3.5–5.1)
Sodium: 130 mmol/L — ABNORMAL LOW (ref 135–145)

## 2023-08-09 LAB — TROPONIN I (HIGH SENSITIVITY)
Troponin I (High Sensitivity): 9 ng/L (ref <18)
Troponin I (High Sensitivity): 9 ng/L (ref <18)

## 2023-08-09 MED ORDER — SODIUM CHLORIDE 0.9 % IV BOLUS
1000.0000 mL | Freq: Once | INTRAVENOUS | Status: AC
  Administered 2023-08-09: 1000 mL via INTRAVENOUS

## 2023-08-09 MED ORDER — IOHEXOL 350 MG/ML SOLN
100.0000 mL | Freq: Once | INTRAVENOUS | Status: AC | PRN
  Administered 2023-08-09: 100 mL via INTRAVENOUS

## 2023-08-09 NOTE — ED Provider Notes (Signed)
 [facility] Provider Note    Event Date/Time   First MD Initiated Contact with Patient 08/09/23 2051     (approximate)   History   Chest Pain   HPI  Alejandro Sheppard is a 89 y.o. male past medical history history of heart murmur, who presents to the emergency department chest pain.  States that he had an episode earlier today of severe chest pain to his right shoulder blade.  States that it was a sharp stabbing pain that was ongoing and constant and rated as a 9/10.  States that since arriving to the emergency department he has had some improvement of his chest pain.  States that his pain then radiated down to his lower abdomen and has had some mild ongoing pain since that time.  No further episodes of nausea or vomiting.  Denies any active shortness of breath at rest but does state that he gets significantly short of breath whenever he exerts himself even minimally.  Does follow-up as an outpatient with cardiology.  Last tress test he believes was approximately 2 years ago.  No dysuria, urinary urgency or frequency.  No recent viral illness.  Denies fever or chills.  States that he is had an ongoing cough from postnasal drip for the past 30 years of his life.  On chart review last echocardiogram that I can see in the system is from 2022 and did have findings of aortic stenosis with a questionable bicuspid valve at that time.     Physical Exam   Triage Vital Signs: ED Triage Vitals [08/09/23 1910]  Encounter Vitals Group     BP (!) 152/77     Systolic BP Percentile      Diastolic BP Percentile      Pulse Rate 66     Resp 16     Temp 98.6 F (37 C)     Temp Source Oral     SpO2 96 %     Weight 160 lb 15 oz (73 kg)     Height 5\' 10"  (1.778 m)     Head Circumference      Peak Flow      Pain Score 5     Pain Loc      Pain Education      Exclude from Growth Chart     Most recent vital signs: Vitals:   08/09/23 1910 08/09/23 2106  BP: (!) 152/77    Pulse: 66 (!) 50  Resp: 16 18  Temp: 98.6 F (37 C)   SpO2: 96% 98%    Physical Exam Constitutional:      Appearance: He is well-developed.  HENT:     Head: Atraumatic.  Eyes:     Conjunctiva/sclera: Conjunctivae normal.  Cardiovascular:     Rate and Rhythm: Regular rhythm. Bradycardia present.     Pulses:          Radial pulses are 2+ on the right side and 2+ on the left side.       Dorsalis pedis pulses are 2+ on the right side and 2+ on the left side.     Heart sounds: Murmur heard.     Systolic murmur is present.  Pulmonary:     Effort: No respiratory distress.     Breath sounds: No decreased breath sounds.  Abdominal:     Palpations: Abdomen is soft.     Tenderness: There is abdominal tenderness (Right lower quadrant abdominal tenderness to palpation with no rebound  or guarding).  Musculoskeletal:     Cervical back: Normal range of motion.  Skin:    General: Skin is warm.     Capillary Refill: Capillary refill takes less than 2 seconds.  Neurological:     General: No focal deficit present.     Mental Status: He is alert. Mental status is at baseline.     IMPRESSION / MDM / ASSESSMENT AND PLAN / ED COURSE  I reviewed the triage vital signs and the nursing notes.  Differential diagnosis including pulmonary embolism, pneumonia, ACS, pericarditis, referred pain from intra-abdominal process including kidney stone, pyelonephritis or cholecystitis.  Possible progression of his aortic stenosis.  EKG  I, Viviano Ground, the attending physician, personally viewed and interpreted this ECG.   Rate: Normal  Rhythm: Normal sinus  Axis: Normal  Intervals: Normal  ST&T Change: None Episodes of bradycardia while on cardiac telemetry.  RADIOLOGY I independently reviewed imaging, my interpretation of imaging: Chest x-ray concerning for possible developing bronchopneumonia  CTA ordered  CT scan abdomen and pelvis ordered  LABS (all labs ordered are listed, but only  abnormal results are displayed) Labs interpreted as -    Labs Reviewed  BASIC METABOLIC PANEL WITH GFR - Abnormal; Notable for the following components:      Result Value   Sodium 130 (*)    Chloride 96 (*)    Glucose, Bld 133 (*)    All other components within normal limits  CBC - Abnormal; Notable for the following components:   WBC 15.2 (*)    All other components within normal limits  HEPATIC FUNCTION PANEL  LIPASE, BLOOD  TROPONIN I (HIGH SENSITIVITY)  TROPONIN I (HIGH SENSITIVITY)  TROPONIN I (HIGH SENSITIVITY)     MDM  Initial troponin is negative.  Does have leukocytosis of 15.2.  Chronic appears to be at baseline.  No significant electrolyte abnormalities with only mild hyponatremia.   Bedside cardiac ultrasound with no findings of pericardial effusion appears to have normal EF.  Low suspicion for acute pericarditis and does not have any EKG changes.   Clinical Course as of 08/10/23 0018  Mon Aug 09, 2023  2340 Notified by radiology tech that patient required multiple contrast boluses due to movement when attempting to obtain his CT scans.  No known history of heart failure, ordered for IV fluids. [NR]    Clinical Course User Index [NR] Claria Crofts, MD     PROCEDURES:  Critical Care performed: No  Ultrasound ED Echo  Date/Time: 08/10/2023 12:17 AM  Performed by: Viviano Ground, MD Authorized by: Viviano Ground, MD   Procedure details:    Indications: chest pain and dyspnea     Views: subxiphoid, parasternal long axis view and apical 4 chamber view     Images: not archived     Limitations:  Positioning and acoustic shadowing Findings:    Pericardium: no pericardial effusion     LV Function: normal (>50% EF)     RV Diameter: normal     IVC: normal   Impression:    Impression: normal     Patient's presentation is most consistent with acute presentation with potential threat to life or bodily function.   MEDICATIONS ORDERED IN ED: Medications   iohexol  (OMNIPAQUE ) 350 MG/ML injection 100 mL (100 mLs Intravenous Contrast Given 08/09/23 2331)  sodium chloride  0.9 % bolus 1,000 mL (1,000 mLs Intravenous New Bag/Given 08/09/23 2359)    FINAL CLINICAL IMPRESSION(S) / ED DIAGNOSES   Final diagnoses:  Chest pain,  unspecified type  Palpitations  Acute cholecystitis     Rx / DC Orders   ED Discharge Orders     None        Note:  This document was prepared using Dragon voice recognition software and may include unintentional dictation errors.   Viviano Ground, MD 08/10/23 [phone]

## 2023-08-09 NOTE — ED Triage Notes (Signed)
 Pt presents via POV c/o chest pain and right shoulder pain that started earlier today. Reports pain then moved to right flank. Reports pain is more dull now but still present in right side of chest and right shoulder.   Ambulatory to triage. A&O x4.

## 2023-08-10 ENCOUNTER — Observation Stay: Admitting: Anesthesiology

## 2023-08-10 ENCOUNTER — Encounter
Admission: EM | Disposition: A | Discharge status: Home / Self Care | Payer: Self-pay | Source: Home / Self Care | Attending: Emergency Medicine

## 2023-08-10 ENCOUNTER — Other Ambulatory Visit: Payer: Self-pay

## 2023-08-10 ENCOUNTER — Observation Stay: Admit: 2023-08-10 | Discharge: 2023-08-10 | Disposition: A | Discharge status: Home / Self Care

## 2023-08-10 DIAGNOSIS — I4891 Unspecified atrial fibrillation: Secondary | ICD-10-CM | POA: Diagnosis not present

## 2023-08-10 DIAGNOSIS — K810 Acute cholecystitis: Secondary | ICD-10-CM | POA: Diagnosis not present

## 2023-08-10 DIAGNOSIS — Z01810 Encounter for preprocedural cardiovascular examination: Secondary | ICD-10-CM | POA: Diagnosis not present

## 2023-08-10 LAB — HEPATIC FUNCTION PANEL
ALT: 20 U/L (ref 0–44)
AST: 27 U/L (ref 15–41)
Albumin: 3.9 g/dL (ref 3.5–5.0)
Alkaline Phosphatase: 66 U/L (ref 38–126)
Bilirubin, Direct: 0.2 mg/dL (ref 0.0–0.2)
Indirect Bilirubin: 0.7 mg/dL (ref 0.3–0.9)
Total Bilirubin: 0.9 mg/dL (ref 0.0–1.2)
Total Protein: 6.7 g/dL (ref 6.5–8.1)

## 2023-08-10 SURGERY — CHOLECYSTECTOMY, ROBOT-ASSISTED, LAPAROSCOPIC
Anesthesia: General | Site: Abdomen

## 2023-08-10 MED ORDER — ONDANSETRON HCL 4 MG/2ML IJ SOLN
INTRAMUSCULAR | Status: AC
  Filled 2023-08-10: qty 2

## 2023-08-10 MED ORDER — ACETAMINOPHEN 10 MG/ML IV SOLN
INTRAVENOUS | Status: AC
  Filled 2023-08-10: qty 100

## 2023-08-10 MED ORDER — PANTOPRAZOLE SODIUM 40 MG IV SOLR
40.0000 mg | Freq: Every day | INTRAVENOUS | Status: DC
  Administered 2023-08-10 (×2): 40 mg via INTRAVENOUS
  Filled 2023-08-10 (×2): qty 10

## 2023-08-10 MED ORDER — BUPIVACAINE-EPINEPHRINE 0.25% -1:200000 IJ SOLN
INTRAMUSCULAR | Status: DC | PRN
  Administered 2023-08-10: 20 mL

## 2023-08-10 MED ORDER — CITALOPRAM HYDROBROMIDE 20 MG PO TABS
30.0000 mg | ORAL_TABLET | Freq: Every day | ORAL | Status: DC
  Administered 2023-08-11: 30 mg via ORAL
  Filled 2023-08-10: qty 2

## 2023-08-10 MED ORDER — ONDANSETRON HCL 4 MG/2ML IJ SOLN
4.0000 mg | Freq: Four times a day (QID) | INTRAMUSCULAR | Status: DC | PRN
Start: 2023-08-10 — End: 2023-08-11

## 2023-08-10 MED ORDER — ROCURONIUM BROMIDE 100 MG/10ML IV SOLN
INTRAVENOUS | Status: DC | PRN
  Administered 2023-08-10: 20 mg via INTRAVENOUS
  Administered 2023-08-10: 40 mg via INTRAVENOUS
  Administered 2023-08-10: 20 mg via INTRAVENOUS

## 2023-08-10 MED ORDER — AMLODIPINE BESYLATE 10 MG PO TABS
10.0000 mg | ORAL_TABLET | Freq: Every day | ORAL | Status: DC
  Administered 2023-08-11: 10 mg via ORAL
  Filled 2023-08-10: qty 1

## 2023-08-10 MED ORDER — FENTANYL CITRATE (PF) 100 MCG/2ML IJ SOLN
INTRAMUSCULAR | Status: DC | PRN
Start: 2023-08-10 — End: 2023-08-10
  Administered 2023-08-10: 25 ug via INTRAVENOUS
  Administered 2023-08-10: 50 ug via INTRAVENOUS

## 2023-08-10 MED ORDER — FENTANYL CITRATE (PF) 100 MCG/2ML IJ SOLN: 25.0000 ug | INTRAMUSCULAR | Status: DC | PRN

## 2023-08-10 MED ORDER — 0.9 % SODIUM CHLORIDE (POUR BTL) OPTIME
TOPICAL | Status: DC | PRN
  Administered 2023-08-10: 500 mL

## 2023-08-10 MED ORDER — INDOCYANINE GREEN 25 MG IV SOLR
INTRAVENOUS | Status: AC
  Filled 2023-08-10: qty 10

## 2023-08-10 MED ORDER — ACETAMINOPHEN 10 MG/ML IV SOLN
1000.0000 mg | Freq: Once | INTRAVENOUS | Status: DC | PRN
  Administered 2023-08-10: 1000 mg via INTRAVENOUS

## 2023-08-10 MED ORDER — CIPROFLOXACIN IN D5W 400 MG/200ML IV SOLN
400.0000 mg | Freq: Two times a day (BID) | INTRAVENOUS | Status: DC
  Administered 2023-08-10: 400 mg via INTRAVENOUS
  Filled 2023-08-10 (×2): qty 200

## 2023-08-10 MED ORDER — PROPOFOL 10 MG/ML IV BOLUS
INTRAVENOUS | Status: DC | PRN
  Administered 2023-08-10: 100 mg via INTRAVENOUS

## 2023-08-10 MED ORDER — ONDANSETRON HCL 4 MG/2ML IJ SOLN: 4.0000 mg | Freq: Once | INTRAMUSCULAR | Status: DC | PRN

## 2023-08-10 MED ORDER — LACTATED RINGERS IV SOLN: INTRAVENOUS | Status: DC | PRN

## 2023-08-10 MED ORDER — ROCURONIUM BROMIDE 10 MG/ML (PF) SYRINGE
PREFILLED_SYRINGE | INTRAVENOUS | Status: AC
  Filled 2023-08-10: qty 10

## 2023-08-10 MED ORDER — METOPROLOL TARTRATE 50 MG PO TABS
50.0000 mg | ORAL_TABLET | Freq: Two times a day (BID) | ORAL | Status: DC
  Administered 2023-08-10 – 2023-08-11 (×2): 50 mg via ORAL
  Filled 2023-08-10 (×2): qty 1

## 2023-08-10 MED ORDER — ACETAMINOPHEN 650 MG RE SUPP: 650.0000 mg | Freq: Four times a day (QID) | RECTAL | Status: DC | PRN

## 2023-08-10 MED ORDER — EPHEDRINE SULFATE-NACL 50-0.9 MG/10ML-% IV SOSY
PREFILLED_SYRINGE | INTRAVENOUS | Status: DC | PRN
  Administered 2023-08-10: 5 mg via INTRAVENOUS
  Administered 2023-08-10: 2.5 mg via INTRAVENOUS

## 2023-08-10 MED ORDER — OXYCODONE HCL 5 MG/5ML PO SOLN: 5.0000 mg | Freq: Once | ORAL | Status: DC | PRN

## 2023-08-10 MED ORDER — VISTASEAL 10 ML SINGLE DOSE KIT
PACK | CUTANEOUS | Status: DC | PRN
  Administered 2023-08-10: 10 mL via TOPICAL

## 2023-08-10 MED ORDER — PROPOFOL 10 MG/ML IV BOLUS
INTRAVENOUS | Status: AC
  Filled 2023-08-10: qty 20

## 2023-08-10 MED ORDER — FENTANYL CITRATE (PF) 100 MCG/2ML IJ SOLN
INTRAMUSCULAR | Status: AC
Start: 2023-08-10 — End: ?
  Filled 2023-08-10: qty 2

## 2023-08-10 MED ORDER — DEXAMETHASONE SODIUM PHOSPHATE 10 MG/ML IJ SOLN
INTRAMUSCULAR | Status: AC
  Filled 2023-08-10: qty 1

## 2023-08-10 MED ORDER — EPHEDRINE 5 MG/ML INJ
INTRAVENOUS | Status: AC
  Filled 2023-08-10: qty 5

## 2023-08-10 MED ORDER — SUGAMMADEX SODIUM 500 MG/5ML IV SOLN
INTRAVENOUS | Status: DC | PRN
  Administered 2023-08-10: 200 mg via INTRAVENOUS

## 2023-08-10 MED ORDER — ONDANSETRON HCL 4 MG/2ML IJ SOLN
INTRAMUSCULAR | Status: DC | PRN
  Administered 2023-08-10: 4 mg via INTRAVENOUS

## 2023-08-10 MED ORDER — METRONIDAZOLE 500 MG/100ML IV SOLN
500.0000 mg | Freq: Two times a day (BID) | INTRAVENOUS | Status: DC
  Administered 2023-08-10: 500 mg via INTRAVENOUS
  Filled 2023-08-10 (×2): qty 100

## 2023-08-10 MED ORDER — BUPIVACAINE-EPINEPHRINE (PF) 0.25% -1:200000 IJ SOLN
INTRAMUSCULAR | Status: AC
  Filled 2023-08-10: qty 30

## 2023-08-10 MED ORDER — MORPHINE SULFATE (PF) 4 MG/ML IV SOLN: 4.0000 mg | INTRAVENOUS | Status: DC | PRN

## 2023-08-10 MED ORDER — FENTANYL CITRATE (PF) 100 MCG/2ML IJ SOLN
INTRAMUSCULAR | Status: AC
  Filled 2023-08-10: qty 2

## 2023-08-10 MED ORDER — APIXABAN 5 MG PO TABS
5.0000 mg | ORAL_TABLET | Freq: Two times a day (BID) | ORAL | Status: DC
  Administered 2023-08-10 – 2023-08-11 (×3): 5 mg via ORAL
  Filled 2023-08-10 (×3): qty 1

## 2023-08-10 MED ORDER — HYDROCODONE-ACETAMINOPHEN 5-325 MG PO TABS
1.0000 | ORAL_TABLET | ORAL | Status: DC | PRN
  Administered 2023-08-10 – 2023-08-11 (×3): 1 via ORAL
  Filled 2023-08-10 (×3): qty 1

## 2023-08-10 MED ORDER — LIDOCAINE HCL (CARDIAC) PF 100 MG/5ML IV SOSY
PREFILLED_SYRINGE | INTRAVENOUS | Status: DC | PRN
  Administered 2023-08-10: 100 mg via INTRAVENOUS

## 2023-08-10 MED ORDER — VISTASEAL 10 ML SINGLE DOSE KIT
PACK | CUTANEOUS | Status: AC
  Filled 2023-08-10: qty 10

## 2023-08-10 MED ORDER — TRAZODONE HCL 50 MG PO TABS
250.0000 mg | ORAL_TABLET | Freq: Every day | ORAL | Status: DC
  Administered 2023-08-10: 250 mg via ORAL
  Filled 2023-08-10: qty 1

## 2023-08-10 MED ORDER — DEXAMETHASONE SODIUM PHOSPHATE 10 MG/ML IJ SOLN
INTRAMUSCULAR | Status: DC | PRN
  Administered 2023-08-10: 5 mg via INTRAVENOUS

## 2023-08-10 MED ORDER — OXYCODONE HCL 5 MG PO TABS: 5.0000 mg | ORAL_TABLET | Freq: Once | ORAL | Status: DC | PRN

## 2023-08-10 MED ORDER — DIPHENHYDRAMINE HCL 50 MG/ML IJ SOLN
50.0000 mg | Freq: Four times a day (QID) | INTRAMUSCULAR | Status: DC | PRN
  Administered 2023-08-10: 50 mg via INTRAVENOUS
  Filled 2023-08-10: qty 1

## 2023-08-10 MED ORDER — LIDOCAINE HCL (PF) 2 % IJ SOLN
INTRAMUSCULAR | Status: AC
  Filled 2023-08-10: qty 5

## 2023-08-10 MED ORDER — ACETAMINOPHEN 325 MG PO TABS: 650.0000 mg | ORAL_TABLET | Freq: Four times a day (QID) | ORAL | Status: DC | PRN

## 2023-08-10 MED ORDER — ONDANSETRON 4 MG PO TBDP: 4.0000 mg | ORAL_TABLET | Freq: Four times a day (QID) | ORAL | Status: DC | PRN

## 2023-08-10 MED ORDER — INDOCYANINE GREEN 25 MG IV SOLR
1.2500 mg | Freq: Once | INTRAVENOUS | Status: AC
  Administered 2023-08-10: 1.25 mg via INTRAVENOUS

## 2023-08-10 MED ORDER — ENOXAPARIN SODIUM 40 MG/0.4ML IJ SOSY
40.0000 mg | PREFILLED_SYRINGE | INTRAMUSCULAR | Status: DC
  Administered 2023-08-10: 40 mg via SUBCUTANEOUS
  Filled 2023-08-10: qty 0.4

## 2023-08-10 SURGICAL SUPPLY — 52 items
APPLICATOR VISTASEAL FLEXIBLE (MISCELLANEOUS) IMPLANT
BAG PRESSURE INF DISP 1000 (BAG) IMPLANT
BAG PRESSURE INF REUSE 1000 (BAG) IMPLANT
CANNULA REDUCER 12-8 DVNC XI (CANNULA) ×1 IMPLANT
CATH REDDICK CHOLANGI 4FR 50CM (CATHETERS) IMPLANT
CAUTERY HOOK MNPLR 1.6 DVNC XI (INSTRUMENTS) ×1 IMPLANT
CLIP APPLIE XI LRG REUSE DVNC (INSTRUMENTS) IMPLANT
CLIP LIGATING HEM O LOK PURPLE (MISCELLANEOUS) IMPLANT
CLIP LIGATING HEMO O LOK GREEN (MISCELLANEOUS) ×1 IMPLANT
DERMABOND ADVANCED .7 DNX12 (GAUZE/BANDAGES/DRESSINGS) ×1 IMPLANT
DRAIN CHANNEL JP 15F RND 3/16 (MISCELLANEOUS) IMPLANT
DRAPE ARM DVNC X/XI (DISPOSABLE) ×4 IMPLANT
DRAPE C-ARM XRAY 36X54 (DRAPES) IMPLANT
DRAPE COLUMN DVNC XI (DISPOSABLE) ×1 IMPLANT
DRSG TEGADERM 4X4.75 (GAUZE/BANDAGES/DRESSINGS) IMPLANT
ELECTRODE REM PT RTRN 9FT ADLT (ELECTROSURGICAL) ×1 IMPLANT
EVACUATOR SILICONE 100CC (DRAIN) IMPLANT
FORCEPS BPLR 8 MD DVNC XI (FORCEP) ×1 IMPLANT
FORCEPS BPLR FENES DVNC XI (FORCEP) ×1 IMPLANT
FORCEPS PROGRASP DVNC XI (FORCEP) ×1 IMPLANT
GLOVE BIO SURGEON STRL SZ 6.5 (GLOVE) ×2 IMPLANT
GLOVE BIOGEL PI IND STRL 6.5 (GLOVE) ×2 IMPLANT
GLOVE SURG SYN 6.5 ES PF (GLOVE) ×2 IMPLANT
GLOVE SURG SYN 6.5 PF PI (GLOVE) ×2 IMPLANT
GOWN STRL REUS W/ TWL LRG LVL3 (GOWN DISPOSABLE) ×4 IMPLANT
GRASPER SUT TROCAR 14GX15 (MISCELLANEOUS) ×1 IMPLANT
IRRIGATOR SUCT 8 DISP DVNC XI (IRRIGATION / IRRIGATOR) IMPLANT
IV CATH ANGIO 12GX3 LT BLUE (NEEDLE) IMPLANT
IV NS 1000ML BAXH (IV SOLUTION) IMPLANT
KIT PINK PAD W/HEAD ARE REST (MISCELLANEOUS) ×1 IMPLANT
KIT PINK PAD W/HEAD ARM REST (MISCELLANEOUS) ×1 IMPLANT
LABEL OR SOLS (LABEL) ×1 IMPLANT
MANIFOLD NEPTUNE II (INSTRUMENTS) ×1 IMPLANT
NDL HYPO 22X1.5 SAFETY MO (MISCELLANEOUS) ×1 IMPLANT
NDL INSUFFLATION 14GA 120MM (NEEDLE) ×1 IMPLANT
NEEDLE HYPO 22X1.5 SAFETY MO (MISCELLANEOUS) ×1 IMPLANT
NEEDLE INSUFFLATION 14GA 120MM (NEEDLE) ×1 IMPLANT
NS IRRIG 500ML POUR BTL (IV SOLUTION) ×1 IMPLANT
OBTURATOR OPTICALSTD 8 DVNC (TROCAR) ×1 IMPLANT
PACK LAP CHOLECYSTECTOMY (MISCELLANEOUS) ×1 IMPLANT
SEAL UNIV 5-12 XI (MISCELLANEOUS) ×4 IMPLANT
SET TUBE SMOKE EVAC HIGH FLOW (TUBING) ×1 IMPLANT
SOLUTION ELECTROSURG ANTI STCK (MISCELLANEOUS) ×1 IMPLANT
SPIKE FLUID TRANSFER (MISCELLANEOUS) ×2 IMPLANT
SPONGE DRAIN TRACH 4X4 STRL 2S (GAUZE/BANDAGES/DRESSINGS) IMPLANT
SPONGE T-LAP 4X18 ~~LOC~~+RFID (SPONGE) IMPLANT
SUT VICRYL 0 UR6 27IN ABS (SUTURE) ×1 IMPLANT
SUTURE EHLN 3-0 FS-10 30 BLK (SUTURE) IMPLANT
SUTURE MNCRL 4-0 27XMF (SUTURE) ×1 IMPLANT
SYSTEM BAG RETRIEVAL 10MM (BASKET) ×1 IMPLANT
TRAP FLUID SMOKE EVACUATOR (MISCELLANEOUS) ×1 IMPLANT
WATER STERILE IRR 500ML POUR (IV SOLUTION) ×1 IMPLANT

## 2023-08-10 NOTE — Plan of Care (Signed)

## 2023-08-10 NOTE — Op Note (Signed)
 Preoperative diagnosis: Acute cholecystitis  Postoperative diagnosis: Acute cholecystitis  Procedure: Robotic Assisted Laparoscopic Cholecystectomy.   Anesthesia: GETA   Surgeon: Dr. Dortha Gauss  Wound Classification: Clean Contaminated  Indications: Patient is a 89 y.o. male developed right upper quadrant pain, leukocytosis and on workup was found to have cholelithiasis with a normal common duct with cholecystitis. Robotic Assisted Laparoscopic cholecystectomy was elected.  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 Gallbladder neck with adhesions to the common bile duct Critical view of safety achieved Cystic duct and artery identified, ligated and divided Adequate hemostasis  Description of procedure: The patient was placed on the operating table in the supine position. General anesthesia was induced. A time-out was completed verifying correct patient, procedure, site, positioning, and implant(s) and/or special equipment prior to beginning this procedure. An orogastric tube was placed. The abdomen was prepped and draped in the usual sterile fashion.  An incision was made in a natural skin line below the umbilicus.  The fascia was elevated and the Veress needle inserted. Proper position was confirmed by aspiration and saline meniscus test.  The abdomen was insufflated with carbon dioxide to a pressure of 15 mmHg. The patient tolerated insufflation well. A 8-mm trocar was then inserted in optiview fashion.  The laparoscope was inserted and the abdomen inspected. No injuries from initial trocar placement were noted. Additional trocars were then inserted in the following locations: an 8-mm trocar in the left lateral abdomen, and another two 8-mm trocars to the right side of the abdomen 5 cm appart. The umbilical trocar was changed to a 12 mm trocar all under direct visualization. The  abdomen was inspected and no abnormalities were found. The table was placed in the reverse Trendelenburg position with the right side up. The robotic arms were docked and target anatomy identified. Instrument inserted under direct visualization.  Due to the transverse colon and hepatic flexure high in the diaphragm it was a difficult and time-consuming mobilization of the hepatic flexure and transverse colon to identify the gallbladder.  Finally the dome of the gallbladder was grasped with a prograsp and retracted over the dome of the liver. The infundibulum was also grasped with an atraumatic grasper and retracted toward the right lower quadrant. This maneuver exposed Calot's triangle.  There was some seropurulent pericholecystic fluid that was aspirated with suction.  With this I was able to identify the common bile diet with adhesions to the neck of the gallbladder from the inflammation.  Will plan on dissection I was able to separate the common bile duct from the neck of the gallbladder.  The peritoneum overlying the gallbladder infundibulum was then incised and the cystic duct and cystic artery identified and circumferentially dissected. Critical view of safety reviewed before ligating any structure. Firefly images taken to visualize biliary ducts. The cystic duct and cystic artery were then doubly clipped and divided close to the gallbladder.  The gallbladder was then dissected from its peritoneal attachments by electrocautery. Hemostasis was checked and the gallbladder and contained stones were removed using an endoscopic retrieval bag. The gallbladder was passed off the table as a specimen. The gallbladder fossa was copiously irrigated with saline and hemostasis was obtained. There was no evidence of bleeding from the gallbladder fossa or cystic artery or leakage of the bile from the cystic duct stump.  The gallbladder bed was irrigated with Vistaseal.  A 15 French drain was left in the right upper  quadrant for further drainage of seropurulent fluid.  Secondary trocars were removed under direct vision. No bleeding was noted. The robotic arms were undoked. The scope was withdrawn and the umbilical trocar removed. The abdomen was allowed to collapse. The fascia of the 12mm trocar sites was closed with figure-of-eight 0 vicryl sutures. The skin was closed with subcuticular sutures of 4-0 monocryl and topical skin adhesive. The orogastric tube was removed.  The patient tolerated the procedure well and was taken to the postanesthesia care unit in stable condition.   Specimen: Gallbladder  Complications: None  EBL: 10 mL

## 2023-08-10 NOTE — ED Notes (Signed)
 Patient assisted to bedside commode. Pt changed to clean hospital gown. Wife at bedside. No other needs at this time.

## 2023-08-10 NOTE — ED Provider Notes (Signed)
 Care of this patient assumed from prior physician at 2300 pending CTs and disposition. Please see prior physician note for further details.  Briefly this is a 89 year old male who presented to the emergency department for right sided chest pain with radiation to his right shoulder, constant.  Right-sided abdominal pain on exam.  Labs with leukocytosis of 15.2.  Signed out to me pending CTA of the chest and CT abdomen pelvis.    Clinical Course as of 08/10/23 0112  Mon Aug 09, 2023  2340 Notified by radiology tech that patient required multiple contrast boluses due to movement when attempting to obtain his CT scans.  No known history of heart failure, ordered for IV fluids. [NR]  Tue Aug 10, 2023  0034 CTA without PE or focal consolidation.  CT abdomen pelvis does note findings concerning for acute cholecystitis with hyperemia of the gallbladder wall and moderate pericholecystic inflammatory changes.  Patient reassessed and updated on results of his workup.  He does have right upper quadrant pain on exam.  He is agreeable with plan for admission, surgery consultation.  Will reach out to general surgery team. [NR]  0110 Case reviewed with Dr. Charmel Cooter. He will evaluate the patient for anticipated admission. Patient updated and agreeable with plan.  IV antibiotics ordered by admitting team. [NR]    Clinical Course User Index [NR] Claria Crofts, MD      Claria Crofts, MD 08/10/23 [phone]

## 2023-08-10 NOTE — ED Notes (Signed)
 This RN notified by pharmacy tech that pt was having possible allergic reaction to antibiotics. Abx stopped, Cintron-Diaz, MD notified. IV flushed and saline locked. Pts right arm red and itching. Per pt "it isn't itching as bad now that you stopped it."

## 2023-08-10 NOTE — Anesthesia Procedure Notes (Signed)
 Procedure Name: Intubation Date/Time: 08/10/2023 11:45 AM  Performed by: Bill Budd, CRNAPre-anesthesia Checklist: Patient identified, Emergency Drugs available, Suction available, Patient being monitored and Timeout performed Patient Re-evaluated:Patient Re-evaluated prior to induction Oxygen Delivery Method: Circle system utilized Preoxygenation: Pre-oxygenation with 100% oxygen Induction Type: IV induction Ventilation: Oral airway inserted - appropriate to patient size Laryngoscope Size: McGrath and 4 Grade View: Grade I Tube type: Oral Tube size: 7.0 mm Number of attempts: 1 Airway Equipment and Method: Video-laryngoscopy Placement Confirmation: ETT inserted through vocal cords under direct vision, positive ETCO2 and breath sounds checked- equal and bilateral Secured at: 22 cm Tube secured with: Tape Dental Injury: Teeth and Oropharynx as per pre-operative assessment

## 2023-08-10 NOTE — Transfer of Care (Signed)
 Immediate Anesthesia Transfer of Care Note  Patient: Alejandro Sheppard  Procedure(s) Performed: CHOLECYSTECTOMY, ROBOT-ASSISTED, LAPAROSCOPIC (Abdomen)  Patient Location: PACU  Anesthesia Type:General  Level of Consciousness: drowsy  Airway & Oxygen Therapy: Patient Spontanous Breathing and Patient connected to face mask oxygen  Post-op Assessment: Report given to RN and Post -op Vital signs reviewed and stable  Post vital signs: Reviewed and stable  Last Vitals:  Vitals Value Taken Time  BP 109/42 08/10/23 1430  Temp 36.6 C 08/10/23 1330  Pulse 55 08/10/23 1439  Resp    SpO2 97 % 08/10/23 1439  Vitals shown include unfiled device data.  Last Pain:  Vitals:   08/10/23 1430  TempSrc:   PainSc: 0-No pain         Complications: No notable events documented.

## 2023-08-10 NOTE — Consult Note (Addendum)
 [facility] CLINIC CARDIOLOGY CONSULT NOTE       Patient ID: MAKEL MCMANN MRN: [id number] DOB/AGE: [id number] y.o.  Admit date: 08/09/2023 Referring Physician Dr. Girard Lam Primary Physician Lyle San, MD Primary Cardiologist Dr. Parks Bollman Reason for Consultation Atrial Fibrillation RVR  HPI: NAT LOWENTHAL is a 89 y.o. male  with a past medical history of hypertension, hyperlipidemia, mild aortic stenosis, moderate aortic insufficiency, sinus bradycardia secondary to metoprolol , and no known history of atrial fibrillation who presented to ED on 08/09/2023 for chest pain and right should pain. Patient found to have acute cholecystitis and underwent robotic laparoscopic cholecystectomy on 05/20. Post-op developed atrial fibrillation RVR and soon after spontaneously converted to sinus rhythm. Cardiology was consulted for further evaluation.   Work up in ED notable for Na 130, K 4.4, Cr 0.77, Hgb 13.6, plts 158. CXR with right lung baser opacities. EKG in ED with sinus bradycardia, rate 54 bpm. CT abdomen with evidence of acute cholecystitis. Patient underwent robotic laparoscopic cholecystectomy today (05/20) and post-operatively developed atrial fibrillation RVR rate 110s. Shortly after patient spontaneously converted back to sinus rhythm. Patient has no known history of atrial fibrillation.   At the time of my evaluation this afternoon, patient was resting comfortably in PACU bed in no acute distress. Discussed patient symptoms in further detail. Patient states for the past 6 months he's noticed about once a month his HR elevates to 130s at rest. During these time patient is asymptomatic. Denies any chest pain palpitations, SOB, lightheadedness or swelling. Patient reports he's been taking an extra metoprolol  when he notices his HR is in 130s. Patient denies history of significant bleeding or falls.   Review of systems complete and found to be negative unless listed above    Past Medical History:   Diagnosis Date   Hypertension    Hyponatremia     Past Surgical History:  Procedure Laterality Date   DG ANKLE LEFT COMPLETE ([facility] HX)     SPINAL FUSION      Medications Prior to Admission  Medication Sig Dispense Refill Last Dose/Taking   amLODipine  (NORVASC ) 10 MG tablet Take 10 mg by mouth daily.   08/09/2023 at  3:00 PM   aspirin  EC 81 MG tablet Take 81 mg by mouth at bedtime.   08/08/2023   atorvastatin  (LIPITOR) 40 MG tablet Take 40 mg by mouth every evening.    08/09/2023 at  3:00 PM   cholecalciferol (VITAMIN D3) 25 MCG (1000 UNIT) tablet Take 1,000 Units by mouth daily.   08/08/2023   citalopram  (CELEXA ) 40 MG tablet Take 40 mg by mouth daily.   08/09/2023 at  6:15 AM   Multiple Vitamins-Minerals (SYSTANE ICAPS AREDS2) CAPS Take 1 capsule by mouth 2 (two) times daily.   08/09/2023 at  3:00 PM   traZODone  (DESYREL ) 50 MG tablet Take 250 mg by mouth at bedtime.   08/08/2023   valsartan (DIOVAN) 160 MG tablet Take 1 tablet by mouth 2 (two) times daily.   08/09/2023 at  6:15 AM   zinc sulfate, 50mg  elemental zinc, 220 (50 Zn) MG capsule Take 220 mg by mouth daily.   08/08/2023   metoprolol  tartrate (LOPRESSOR ) 50 MG tablet Take 50 mg by mouth 2 (two) times a day. (Patient not taking: Reported on 08/10/2023)   Not Taking   polyvinyl alcohol (ARTIFICIAL TEARS) 1.4 % ophthalmic solution Apply 1-2 drops to eye daily as needed. (Patient not taking: Reported on 08/10/2023)   Not Taking   Social History  Socioeconomic History   Marital status: Married    Spouse name: Not on file   Number of children: Not on file   Years of education: Not on file   Highest education level: Not on file  Occupational History   Not on file  Tobacco Use   Smoking status: Never   Smokeless tobacco: Never  Substance and Sexual Activity   Alcohol use: No   Drug use: No   Sexual activity: Not on file  Other Topics Concern   Not on file  Social History Narrative   Not on file   Social Drivers of Health    Financial Resource Strain: Low Risk  (12/17/2022)   Received from [facility]   Overall Financial Resource Strain (CARDIA)    Difficulty of Paying Living Expenses: Not hard at all  Food Insecurity: No Food Insecurity (08/10/2023)   Hunger Vital Sign    Worried About Running Out of Food in the Last Year: Never true    Ran Out of Food in the Last Year: Never true  Transportation Needs: No Transportation Needs (08/10/2023)   PRAPARE - Administrator, Civil Service (Medical): No    Lack of Transportation (Non-Medical): No  Physical Activity: Not on file  Stress: Not on file  Social Connections: Socially Integrated (08/10/2023)   Social Connection and Isolation Panel [NHANES]    Frequency of Communication with Friends and Family: More than three times a week    Frequency of Social Gatherings with Friends and Family: Twice a week    Attends Religious Services: More than 4 times per year    Active Member of Clubs or Organizations: Yes    Attends Banker Meetings: Never    Marital Status: Married  Catering manager Violence: Not At Risk (08/10/2023)   Humiliation, Afraid, Rape, and Kick questionnaire    Fear of Current or Ex-Partner: No    Emotionally Abused: No    Physically Abused: No    Sexually Abused: No    Family History  Problem Relation Age of Onset   CVA Mother    Depression Father      Vitals:   08/10/23 0912 08/10/23 1330 08/10/23 1335 08/10/23 1345  BP: 117/62 (!) 113/43  (!) 111/38  Pulse: (!) 54 (!) 57 60   Resp: 18     Temp: 99.3 F (37.4 C) 97.8 F (36.6 C)    TempSrc: Oral     SpO2: 93% 100% 93%   Weight:      Height:        PHYSICAL EXAM General: well appearing elderly male, well nourished, in no acute distress. HEENT: Normocephalic and atraumatic. Neck: No JVD.   Lungs: Normal respiratory effort on 2L. Clear bilaterally to auscultation. No wheezes, crackles, rhonchi.  Heart: HRR, slow HR. Normal S1 and S2 without  gallops or murmurs.  Abdomen: Non-distended appearing.  Msk: Normal strength and tone for age. Extremities: Warm and well perfused. No clubbing, cyanosis. No edema.  Neuro: Alert and oriented X 3. Psych: Answers questions appropriately.   Labs: Basic Metabolic Panel: Recent Labs    08/09/23 1912  NA 130*  K 4.4  CL 96*  CO2 22  GLUCOSE 133*  BUN 19  CREATININE 0.77  CALCIUM  8.9   Liver Function Tests: Recent Labs    08/09/23 1915  AST 27  ALT 20  ALKPHOS 66  BILITOT 0.9  PROT 6.7  ALBUMIN 3.9   No results for input(s): "LIPASE", "  AMYLASE" in the last 72 hours. CBC: Recent Labs    08/09/23 1912  WBC 15.2*  HGB 13.6  HCT 40.8  MCV 93.8  PLT 158   Cardiac Enzymes: Recent Labs    08/09/23 1915 08/09/23 2225  TROPONINIHS 9 9   BNP: No results for input(s): "BNP" in the last 72 hours. D-Dimer: No results for input(s): "DDIMER" in the last 72 hours. Hemoglobin A1C: No results for input(s): "HGBA1C" in the last 72 hours. Fasting Lipid Panel: No results for input(s): "CHOL", "HDL", "LDLCALC", "TRIG", "CHOLHDL", "LDLDIRECT" in the last 72 hours. Thyroid Function Tests: No results for input(s): "TSH", "T4TOTAL", "T3FREE", "THYROIDAB" in the last 72 hours.  Invalid input(s): "FREET3" Anemia Panel: No results for input(s): "VITAMINB12", "FOLATE", "FERRITIN", "TIBC", "IRON", "RETICCTPCT" in the last 72 hours.   Radiology: CT Angio Chest Pulmonary Embolism (PE) W or WO Contrast Result Date: 08/09/2023 CLINICAL DATA:  High probability pulmonary embolism, chest pain, right flank pain EXAM: CT ANGIOGRAPHY CHEST CT ABDOMEN AND PELVIS WITH CONTRAST TECHNIQUE: Multidetector CT imaging of the chest was performed using the standard protocol during bolus administration of intravenous contrast. Multiplanar CT image reconstructions and MIPs were obtained to evaluate the vascular anatomy. Multidetector CT imaging of the abdomen and pelvis was performed using the standard  protocol during bolus administration of intravenous contrast. RADIATION DOSE REDUCTION: This exam was performed according to the departmental dose-optimization program which includes automated exposure control, adjustment of the mA and/or kV according to patient size and/or use of iterative reconstruction technique. CONTRAST:  OMNIPAQUE  IOHEXOL  350 MG/ML SOLN COMPARISON:  None Available. FINDINGS: CTA CHEST FINDINGS Cardiovascular: Adequate opacification of the pulmonary arterial tree. No intraluminal filling defect identified to suggest acute pulmonary embolism. Central pulmonary arteries are of normal caliber. Calcification of the aortic valve leaflets. Moderate coronary artery calcification. Cardiac size is within normal limits. No pericardial effusion. Moderate atherosclerotic calcification within the thoracic aorta. No aortic aneurysm. Mediastinum/Nodes: No enlarged mediastinal, hilar, or axillary lymph nodes. Thyroid gland, trachea, and esophagus demonstrate no significant findings. Lungs/Pleura: Eventration of the right hemidiaphragm anteriorly. Mild right basilar atelectasis. Lungs are otherwise clear. No pneumothorax or pleural effusion. Musculoskeletal: No acute bone abnormality. No lytic or blastic bone lesion. Osseous structures are age appropriate. Review of the MIP images confirms the above findings. CT ABDOMEN and PELVIS FINDINGS Hepatobiliary: Imaging is severely limited by motion artifact on portal venous phase. However, calming CT chest and delayed phase images demonstrates hyperemia of the gallbladder wall and moderate pericholecystic inflammatory changes in keeping with acute cholecystitis. No intra or extrahepatic biliary ductal dilation. Liver otherwise unremarkable. Pancreas: Unremarkable. No pancreatic ductal dilatation or surrounding inflammatory changes. Spleen: Unremarkable Adrenals/Urinary Tract: Adrenal glands are unremarkable. Kidneys are normal, without renal calculi, focal  lesion, or hydronephrosis. Multiple small bladder diverticular are identified suggesting changes of chronic bladder obstruction. Stomach/Bowel: Severe descending and sigmoid diverticulosis. Stomach, small bowel, and large bowel are otherwise unremarkable. Appendix is not clearly identified, however, no secondary signs of appendicitis within the right lower quadrant. No evidence of obstruction or focal inflammation. No free intraperitoneal gas or fluid. Vascular/Lymphatic: Aortic atherosclerosis. No enlarged abdominal or pelvic lymph nodes. Reproductive: Mild prostatic hypertrophy Other: No abdominal wall hernia or abnormality. No abdominopelvic ascites. Musculoskeletal: No acute bone abnormality. No lytic or blastic bone lesion. Osseous structures are age appropriate. Review of the MIP images confirms the above findings. IMPRESSION: 1. No acute pulmonary embolism. 2. Moderate coronary artery calcification. 3. Calcification of the aortic valve leaflets. If clinically  indicated, echocardiography may be helpful to assess the degree of valvular dysfunction. 4. Acute cholecystitis. 5. Severe descending and sigmoid diverticulosis without evidence of acute diverticulitis. 6. Multiple small bladder diverticula suggesting changes of chronic bladder outlet obstruction. Aortic Atherosclerosis (ICD10-I70.0). Electronically Signed   By: Worthy Heads M.D.   On: 08/09/2023 23:51   CT ABDOMEN PELVIS W CONTRAST Result Date: 08/09/2023 CLINICAL DATA:  High probability pulmonary embolism, chest pain, right flank pain EXAM: CT ANGIOGRAPHY CHEST CT ABDOMEN AND PELVIS WITH CONTRAST TECHNIQUE: Multidetector CT imaging of the chest was performed using the standard protocol during bolus administration of intravenous contrast. Multiplanar CT image reconstructions and MIPs were obtained to evaluate the vascular anatomy. Multidetector CT imaging of the abdomen and pelvis was performed using the standard protocol during bolus  administration of intravenous contrast. RADIATION DOSE REDUCTION: This exam was performed according to the departmental dose-optimization program which includes automated exposure control, adjustment of the mA and/or kV according to patient size and/or use of iterative reconstruction technique. CONTRAST:  OMNIPAQUE  IOHEXOL  350 MG/ML SOLN COMPARISON:  None Available. FINDINGS: CTA CHEST FINDINGS Cardiovascular: Adequate opacification of the pulmonary arterial tree. No intraluminal filling defect identified to suggest acute pulmonary embolism. Central pulmonary arteries are of normal caliber. Calcification of the aortic valve leaflets. Moderate coronary artery calcification. Cardiac size is within normal limits. No pericardial effusion. Moderate atherosclerotic calcification within the thoracic aorta. No aortic aneurysm. Mediastinum/Nodes: No enlarged mediastinal, hilar, or axillary lymph nodes. Thyroid gland, trachea, and esophagus demonstrate no significant findings. Lungs/Pleura: Eventration of the right hemidiaphragm anteriorly. Mild right basilar atelectasis. Lungs are otherwise clear. No pneumothorax or pleural effusion. Musculoskeletal: No acute bone abnormality. No lytic or blastic bone lesion. Osseous structures are age appropriate. Review of the MIP images confirms the above findings. CT ABDOMEN and PELVIS FINDINGS Hepatobiliary: Imaging is severely limited by motion artifact on portal venous phase. However, calming CT chest and delayed phase images demonstrates hyperemia of the gallbladder wall and moderate pericholecystic inflammatory changes in keeping with acute cholecystitis. No intra or extrahepatic biliary ductal dilation. Liver otherwise unremarkable. Pancreas: Unremarkable. No pancreatic ductal dilatation or surrounding inflammatory changes. Spleen: Unremarkable Adrenals/Urinary Tract: Adrenal glands are unremarkable. Kidneys are normal, without renal calculi, focal lesion, or hydronephrosis.  Multiple small bladder diverticular are identified suggesting changes of chronic bladder obstruction. Stomach/Bowel: Severe descending and sigmoid diverticulosis. Stomach, small bowel, and large bowel are otherwise unremarkable. Appendix is not clearly identified, however, no secondary signs of appendicitis within the right lower quadrant. No evidence of obstruction or focal inflammation. No free intraperitoneal gas or fluid. Vascular/Lymphatic: Aortic atherosclerosis. No enlarged abdominal or pelvic lymph nodes. Reproductive: Mild prostatic hypertrophy Other: No abdominal wall hernia or abnormality. No abdominopelvic ascites. Musculoskeletal: No acute bone abnormality. No lytic or blastic bone lesion. Osseous structures are age appropriate. Review of the MIP images confirms the above findings. IMPRESSION: 1. No acute pulmonary embolism. 2. Moderate coronary artery calcification. 3. Calcification of the aortic valve leaflets. If clinically indicated, echocardiography may be helpful to assess the degree of valvular dysfunction. 4. Acute cholecystitis. 5. Severe descending and sigmoid diverticulosis without evidence of acute diverticulitis. 6. Multiple small bladder diverticula suggesting changes of chronic bladder outlet obstruction. Aortic Atherosclerosis (ICD10-I70.0). Electronically Signed   By: Worthy Heads M.D.   On: 08/09/2023 23:51   DG Chest Port 1 View Result Date: 08/09/2023 CLINICAL DATA:  [id number] Pain [id number] EXAM: PORTABLE CHEST - 1 VIEW COMPARISON:  September 30, 2018 FINDINGS: Low lung volumes. Elevation of  the right hemidiaphragm with patchy opacities in the right lung base. No focal airspace consolidation, pleural effusion, or pneumothorax. Mild cardiomegaly. No acute fracture or destructive lesion. Multilevel thoracic osteophytosis. Rotator cuff anchors in the left humeral head. Superior subluxation of the right humerus, consistent with chronic rotator cuff tear. IMPRESSION: Low lung volumes. Patchy  opacities in the right lung base, which may represent atelectasis or a developing bronchopneumonia, in the correct clinical context. Electronically Signed   By: Rance Burrows M.D.   On: 08/09/2023 20:10    ECHO ordered.  TELEMETRY reviewed by me 08/10/2023: sinus rhythm, rate 50s  EKG reviewed by me: atrial fibrillation with RVR, LBBB, rate 114 bpm (EKG ED sinus bradycardia, rate 54 bpm)  Data reviewed by me 08/10/2023: last 24h vitals tele labs imaging I/O ED provider note, admission H&P, general surgery note.  Principal Problem:   Acute cholecystitis    ASSESSMENT AND PLAN:  ASHUR GLATFELTER is a 89 y.o. male  with a past medical history of hypertension, hyperlipidemia, mild aortic stenosis, moderate aortic insufficiency, sinus bradycardia secondary to metoprolol , and no known history of atrial fibrillation who presented to ED on 08/09/2023 for chest pain and right should pain. Patient found to have acute cholecystitis and underwent robotic laparoscopic cholecystectomy on 05/20. Post-op developed atrial fibrillation RVR and soon after spontaneously converted to sinus rhythm. Cardiology was consulted for further evaluation.   # Acute cholecystitis s/p cholecystectomy (05/20) # Atrial fibrillation RVR, developed post-operatively  # Hypertension Patient with no known history of atrial fibrillation developed asymptomatic atrial fibrillation RVR rates 110s postoperatively s/p cholecystectomy and shortly after spontaneously converted back into sinus rhythm. Per monitor patient remains in sinus rhythm, rate 50s. Patient has remained asymptomatic. BP borderline.  -Echo ordered. -EKG ordered. -CHADS2VASC of 3 in the setting of age,and HTN history. Would recommend starting anticoagulation for stroke risk reduction. Patient denies any recent falls, or prior major bleeding.  -Ordered PO eliquis 5 mg twice daily. This was cleared by general surgery team. -Recommend resuming home metoprolol  tartrate 50 mg  twice daily when BP allows. Hold SBP < 100, HR < 50 -Outpatient evaluation of atrial fibrillation burden and if patient requires anticoagulation long-term. Unsure if atrial fibrillation only occurred in the setting of surgery/anesthesia.   Follow-up appointment scheduled with Dr. Parks Bollman on 05/27 at 2:00 PM   This patient's plan of care was discussed and created with Dr. Beau Bound and he is in agreement.  Signed: Creighton Doffing, PA-C  08/10/2023, 2:02 PM [facility]

## 2023-08-10 NOTE — Anesthesia Preprocedure Evaluation (Signed)
 Anesthesia Evaluation  Patient identified by MRN, date of birth, ID band Patient awake    Reviewed: Allergy & Precautions, NPO status , Patient's Chart, lab work & pertinent test results  History of Anesthesia Complications Negative for: history of anesthetic complications  Airway Mallampati: IV  TM Distance: >3 FB Neck ROM: Full    Dental  (+) Upper Dentures, Lower Dentures   Pulmonary neg pulmonary ROS, neg sleep apnea, neg COPD, Patient abstained from smoking.Not current smoker   Pulmonary exam normal breath sounds clear to auscultation       Cardiovascular Exercise Tolerance: Good METShypertension, Pt. on medications (-) CAD and (-) Past MI (-) dysrhythmias  Rhythm:Regular Rate:Normal - Systolic murmurs    Neuro/Psych negative neurological ROS  negative psych ROS   GI/Hepatic ,neg GERD  ,,(+)     (-) substance abuse  Acute cholecystitis. Denies nausea or vomiting.   Endo/Other  neg diabetes    Renal/GU negative Renal ROS     Musculoskeletal   Abdominal  (+)  Abdomen: tender.   Peds  Hematology   Anesthesia Other Findings Past Medical History: No date: Hypertension No date: Hyponatremia  Reproductive/Obstetrics                             Anesthesia Physical Anesthesia Plan  ASA: 2  Anesthesia Plan: General   Post-op Pain Management: Ofirmev  IV (intra-op)*   Induction: Intravenous  PONV Risk Score and Plan: 3 and Ondansetron , Dexamethasone and Treatment may vary due to age or medical condition  Airway Management Planned: Oral ETT and Video Laryngoscope Planned  Additional Equipment: None  Intra-op Plan:   Post-operative Plan: Extubation in OR  Informed Consent: I have reviewed the patients History and Physical, chart, labs and discussed the procedure including the risks, benefits and alternatives for the proposed anesthesia with the patient or authorized  representative who has indicated his/her understanding and acceptance.     Dental advisory given  Plan Discussed with: CRNA and Surgeon  Anesthesia Plan Comments: (Discussed risks of anesthesia with patient, including PONV, sore throat, lip/dental/eye damage, post operative cognitive dysfunction. Rare risks discussed as well, such as cardiorespiratory and neurological sequelae, and allergic reactions. Discussed the role of CRNA in patient's perioperative care. Patient understands.)       Anesthesia Quick Evaluation

## 2023-08-10 NOTE — Anesthesia Postprocedure Evaluation (Signed)
 Anesthesia Post Note  Patient: Alejandro Sheppard  Procedure(s) Performed: CHOLECYSTECTOMY, ROBOT-ASSISTED, LAPAROSCOPIC (Abdomen)  Patient location during evaluation: PACU Anesthesia Type: General Level of consciousness: awake and alert Pain management: pain level controlled Vital Signs Assessment: post-procedure vital signs reviewed and stable Respiratory status: spontaneous breathing, nonlabored ventilation, respiratory function stable and patient connected to nasal cannula oxygen Cardiovascular status: blood pressure returned to baseline, stable and bradycardic Postop Assessment: no apparent nausea or vomiting Anesthetic complications: no Comments: Patient with brief episode of atrial fibrillation (apparent new onset) in PACU, captured on EKG in Epic. Spontaneously converted back to sinus bradycardia. I messaged cardiologist Dr. Beau Bound, whose PA stated that they will evaluate this patient.  Patient is hemodynamically stable for discharge from PACU.   No notable events documented.   Last Vitals:  Vitals:   08/10/23 1335 08/10/23 1345  BP:  (!) 111/38  Pulse: 60   Resp:    Temp:    SpO2: 93%     Last Pain:  Vitals:   08/10/23 1354  TempSrc:   PainSc: 4                  Lattie Poli

## 2023-08-10 NOTE — H&P (Signed)
 [facility]- General Surgery SURGICAL HISTORY & PHYSICAL   HISTORY OF PRESENT ILLNESS (HPI):  89 y.o. male presented to [facility] ED  for right sided chest pain. Patient reports pain started yesterday, 08/09/23.  Pain was radiating to right shoulder blade and down the right abdomen.  No known aggravating factors.  Pain improved with pain medication.  Patient states that since arriving to the emergency department, pain has improved. Labs with leukocytosis of 15.2. Cardiac workup in the ED was negative with no EKG changes.  Patient denies any nausea, vomiting, fevers or chills  PAST MEDICAL HISTORY (PMH):  Past Medical History:  Diagnosis Date   Hypertension    Hyponatremia     Reviewed. Otherwise negative.   PAST SURGICAL HISTORY (PSH):  Past Surgical History:  Procedure Laterality Date   DG ANKLE LEFT COMPLETE ([facility] HX)     SPINAL FUSION      Reviewed. Otherwise negative.   MEDICATIONS:  Prior to Admission medications   Medication Sig Start Date End Date Taking? Authorizing Provider  amLODipine  (NORVASC ) 10 MG tablet Take 10 mg by mouth daily. 08/23/18  Yes [provider]  aspirin  EC 81 MG tablet Take 81 mg by mouth at bedtime. 12/10/08  Yes [provider]  atorvastatin  (LIPITOR) 40 MG tablet Take 40 mg by mouth every evening.    Yes [provider]  cholecalciferol (VITAMIN D3) 25 MCG (1000 UNIT) tablet Take 1,000 Units by mouth daily.   Yes [provider]  citalopram  (CELEXA ) 40 MG tablet Take 40 mg by mouth daily. 09/13/18  Yes [provider]  Multiple Vitamins-Minerals (SYSTANE ICAPS AREDS2) CAPS Take 1 capsule by mouth 2 (two) times daily.   Yes [provider]  traZODone  (DESYREL ) 50 MG tablet Take 250 mg by mouth at bedtime. 09/13/18  Yes [provider]  valsartan (DIOVAN) 160 MG tablet Take 1 tablet by mouth 2 (two) times daily. 07/14/23  Yes [provider]  zinc sulfate, 50mg  elemental zinc, 220 (50 Zn)  MG capsule Take 220 mg by mouth daily.   Yes [provider]  metoprolol  tartrate (LOPRESSOR ) 50 MG tablet Take 50 mg by mouth 2 (two) times a day. Patient not taking: Reported on 08/10/2023 12/08/17   [provider]  polyvinyl alcohol (ARTIFICIAL TEARS) 1.4 % ophthalmic solution Apply 1-2 drops to eye daily as needed. Patient not taking: Reported on 08/10/2023    [provider]     ALLERGIES:  Allergies  Allergen Reactions   Metronidazole Itching, Swelling and Rash   Penicillins Rash     SOCIAL HISTORY:  Social History   Socioeconomic History   Marital status: Married    Spouse name: Not on file   Number of children: Not on file   Years of education: Not on file   Highest education level: Not on file  Occupational History   Not on file  Tobacco Use   Smoking status: Never   Smokeless tobacco: Never  Substance and Sexual Activity   Alcohol use: No   Drug use: No   Sexual activity: Not on file  Other Topics Concern   Not on file  Social History Narrative   Not on file   Social Drivers of Health   Financial Resource Strain: Low Risk  (12/17/2022)   Received from [facility]   Overall Financial Resource Strain (CARDIA)    Difficulty of Paying Living Expenses: Not hard at all  Food Insecurity: No Food Insecurity (12/17/2022)  Received from [facility]   Hunger Vital Sign    Worried About Running Out of Food in the Last Year: Never true    Ran Out of Food in the Last Year: Never true  Transportation Needs: No Transportation Needs (12/17/2022)   Received from [facility] - Transportation    In the past 12 months, has lack of transportation kept you from medical appointments or from getting medications?: No    Lack of Transportation (Non-Medical): No  Physical Activity: Not on file  Stress: Not on file  Social Connections: Not on file  Intimate Partner Violence: Not on file      FAMILY HISTORY:  Family History  Problem Relation Age of Onset   CVA Mother    Depression Father     Otherwise negative.   REVIEW OF SYSTEMS:  Review of Systems  Constitutional:  Negative for chills and fever.  Respiratory:  Negative for shortness of breath and wheezing.   Cardiovascular:  Negative for chest pain and palpitations.  Gastrointestinal:  Positive for abdominal pain. Negative for nausea and vomiting.    VITAL SIGNS:  Temp:  [97.9 F (36.6 C)-98.6 F (37 C)] 98.4 F (36.9 C) (05/20 0612) Pulse Rate:  [48-66] 56 (05/20 0612) Resp:  [15-23] 17 (05/20 0612) BP: (114-152)/(42-77) 114/42 (05/20 0612) SpO2:  [91 %-100 %] 95 % (05/20 0612) Weight:  [73 kg] 73 kg (05/19 1910)     Height: 5\' 10"  (177.8 cm) Weight: 73 kg BMI (Calculated): 23.09   PHYSICAL EXAM:  Physical Exam Constitutional:      Appearance: He is well-developed.  HENT:     Head: Normocephalic and atraumatic.  Cardiovascular:     Rate and Rhythm: Normal rate and regular rhythm.  Pulmonary:     Effort: Pulmonary effort is normal.     Breath sounds: Normal breath sounds.  Abdominal:     Palpations: Abdomen is soft.     Tenderness: There is abdominal tenderness. There is no guarding.  Neurological:     Mental Status: He is alert.     INTAKE/OUTPUT:  This shift: No intake/output data recorded.  Last 2 shifts: @IOLAST2SHIFTS @  Labs:     Latest Ref Rng & Units 08/09/2023    7:12 PM 10/01/2018    5:13 AM 09/30/2018   12:35 PM  CBC  WBC 4.0 - 10.5 K/uL 15.2  5.4  6.5   Hemoglobin 13.0 - 17.0 g/dL 16.1  09.6  04.5   Hematocrit 39.0 - 52.0 % 40.8  34.0  34.2   Platelets 150 - 400 K/uL 158  158  170       Latest Ref Rng & Units 08/09/2023    7:15 PM 08/09/2023    7:12 PM 10/01/2018    5:13 AM  CMP  Glucose 70 - 99 mg/dL  409  95   BUN 8 - 23 mg/dL  19  9   Creatinine 8.11 - 1.24 mg/dL  9.14  7.82   Sodium 956 - 145 mmol/L  130  132   Potassium 3.5 - 5.1 mmol/L  4.4  4.4   Chloride 98 - 111  mmol/L  96  99   CO2 22 - 32 mmol/L  22  26   Calcium  8.9 - 10.3 mg/dL  8.9  9.3   Total Protein 6.5 - 8.1 g/dL 6.7     Total Bilirubin 0.0 - 1.2 mg/dL 0.9     Alkaline Phos 38 -  126 U/L 66     AST 15 - 41 U/L 27     ALT 0 - 44 U/L 20        Imaging studies:  EXAM: 08/09/18 CT ANGIOGRAPHY CHEST   CT ABDOMEN AND PELVIS WITH CONTRAST   TECHNIQUE: Multidetector CT imaging of the chest was performed using the standard protocol during bolus administration of intravenous contrast. Multiplanar CT image reconstructions and MIPs were obtained to evaluate the vascular anatomy. Multidetector CT imaging of the abdomen and pelvis was performed using the standard protocol during bolus administration of intravenous contrast.   RADIATION DOSE REDUCTION: This exam was performed according to the departmental dose-optimization program which includes automated exposure control, adjustment of the mA and/or kV according to patient size and/or use of iterative reconstruction technique.   CONTRAST:  OMNIPAQUE  IOHEXOL  350 MG/ML SOLN   COMPARISON:  None Available.   FINDINGS: CTA CHEST FINDINGS   Cardiovascular: Adequate opacification of the pulmonary arterial tree. No intraluminal filling defect identified to suggest acute pulmonary embolism. Central pulmonary arteries are of normal caliber. Calcification of the aortic valve leaflets. Moderate coronary artery calcification. Cardiac size is within normal limits. No pericardial effusion. Moderate atherosclerotic calcification within the thoracic aorta. No aortic aneurysm.   Mediastinum/Nodes: No enlarged mediastinal, hilar, or axillary lymph nodes. Thyroid gland, trachea, and esophagus demonstrate no significant findings.   Lungs/Pleura: Eventration of the right hemidiaphragm anteriorly. Mild right basilar atelectasis. Lungs are otherwise clear. No pneumothorax or pleural effusion.   Musculoskeletal: No acute bone abnormality. No  lytic or blastic bone lesion. Osseous structures are age appropriate.   Review of the MIP images confirms the above findings.   CT ABDOMEN and PELVIS FINDINGS   Hepatobiliary: Imaging is severely limited by motion artifact on portal venous phase. However, calming CT chest and delayed phase images demonstrates hyperemia of the gallbladder wall and moderate pericholecystic inflammatory changes in keeping with acute cholecystitis. No intra or extrahepatic biliary ductal dilation. Liver otherwise unremarkable.   Pancreas: Unremarkable. No pancreatic ductal dilatation or surrounding inflammatory changes.   Spleen: Unremarkable   Adrenals/Urinary Tract: Adrenal glands are unremarkable. Kidneys are normal, without renal calculi, focal lesion, or hydronephrosis. Multiple small bladder diverticular are identified suggesting changes of chronic bladder obstruction.   Stomach/Bowel: Severe descending and sigmoid diverticulosis. Stomach, small bowel, and large bowel are otherwise unremarkable. Appendix is not clearly identified, however, no secondary signs of appendicitis within the right lower quadrant. No evidence of obstruction or focal inflammation. No free intraperitoneal gas or fluid.   Vascular/Lymphatic: Aortic atherosclerosis. No enlarged abdominal or pelvic lymph nodes.   Reproductive: Mild prostatic hypertrophy   Other: No abdominal wall hernia or abnormality. No abdominopelvic ascites.   Musculoskeletal: No acute bone abnormality. No lytic or blastic bone lesion. Osseous structures are age appropriate.   Review of the MIP images confirms the above findings.   IMPRESSION: 1. No acute pulmonary embolism. 2. Moderate coronary artery calcification. 3. Calcification of the aortic valve leaflets. If clinically indicated, echocardiography may be helpful to assess the degree of valvular dysfunction. 4. Acute cholecystitis. 5. Severe descending and sigmoid diverticulosis  without evidence of acute diverticulitis. 6. Multiple small bladder diverticula suggesting changes of chronic bladder outlet obstruction.      Assessment/Plan: 89 y.o. male with acute cholecystitis, complicated by pertinent comorbidities including hypertension, anemia, hyperlipidemia and history of heart murmur   - With a negative heart workup and CT scan showing acute cholecystitis, and it is likely that his symptoms are  caused by the gallbladder  - Discussed the recommendation of having gallbladder removed to prevent recurrence  - Discussed robotic assisted laparoscopic cholecystectomy, minimally invasive with still risk of bleeding infection and injury to other organs  - Patient understands and agrees with surgery  - Will schedule to have surgery this afternoon  - Continue NPO  - Continue pain management  - DVT prophylaxis  -- Maansi Wike Barrientos PA-C

## 2023-08-11 ENCOUNTER — Other Ambulatory Visit (HOSPITAL_COMMUNITY): Payer: Self-pay

## 2023-08-11 ENCOUNTER — Telehealth (HOSPITAL_COMMUNITY): Payer: Self-pay | Admitting: Pharmacy Technician

## 2023-08-11 DIAGNOSIS — K810 Acute cholecystitis: Secondary | ICD-10-CM | POA: Diagnosis not present

## 2023-08-11 LAB — BASIC METABOLIC PANEL WITH GFR
Anion gap: 5 (ref 5–15)
BUN: 21 mg/dL (ref 8–23)
CO2: 25 mmol/L (ref 22–32)
Calcium: 8.3 mg/dL — ABNORMAL LOW (ref 8.9–10.3)
Chloride: 104 mmol/L (ref 98–111)
Creatinine, Ser: 0.77 mg/dL (ref 0.61–1.24)
GFR, Estimated: >60 mL/min (ref >60)
Glucose, Bld: 108 mg/dL — ABNORMAL HIGH (ref 70–99)
Potassium: 4 mmol/L (ref 3.5–5.1)
Sodium: 134 mmol/L — ABNORMAL LOW (ref 135–145)

## 2023-08-11 LAB — ECHOCARDIOGRAM COMPLETE
AR max vel: 1.23 cm2
AV Area VTI: 1.26 cm2
AV Area mean vel: 1.17 cm2
AV Mean grad: 25.8 mmHg
AV Peak grad: 43.9 mmHg
Ao pk vel: 3.31 m/s
Area-P 1/2: 4.53 cm2
Height: 70 in
P 1/2 time: 340 ms
S' Lateral: 3 cm
Weight: 2574.97 [oz_av]

## 2023-08-11 LAB — SURGICAL PATHOLOGY

## 2023-08-11 LAB — CBC
HCT: 32.9 % — ABNORMAL LOW (ref 39.0–52.0)
Hemoglobin: 11.3 g/dL — ABNORMAL LOW (ref 13.0–17.0)
MCH: 31.8 pg (ref 26.0–34.0)
MCHC: 34.3 g/dL (ref 30.0–36.0)
MCV: 92.7 fL (ref 80.0–100.0)
Platelets: 119 10*3/uL — ABNORMAL LOW (ref 150–400)
RBC: 3.55 MIL/uL — ABNORMAL LOW (ref 4.22–5.81)
RDW: 12.5 % (ref 11.5–15.5)
WBC: 12.4 10*3/uL — ABNORMAL HIGH (ref 4.0–10.5)
nRBC: 0 % (ref 0.0–0.2)

## 2023-08-11 MED ORDER — HYDROCODONE-ACETAMINOPHEN 5-325 MG PO TABS: 1.0000 | ORAL_TABLET | Freq: Four times a day (QID) | ORAL | 0 refills | Status: AC | PRN

## 2023-08-11 MED ORDER — APIXABAN 5 MG PO TABS
5.0000 mg | ORAL_TABLET | Freq: Two times a day (BID) | ORAL | 0 refills | Status: AC
Start: 2023-08-11 — End: ?

## 2023-08-11 NOTE — Plan of Care (Signed)

## 2023-08-11 NOTE — Discharge Summary (Signed)
 [facility] Surgery  SURGICAL DISCHARGE SUMMARY  Patient ID: Alejandro Sheppard MRN: [id number] DOB/AGE: [birth year] 89 y.o.  Admit date: 08/09/2023 Discharge date: 08/11/2023  Discharge Diagnoses Patient Active Problem List   Diagnosis Date Noted   Acute cholecystitis 08/10/2023   Hyponatremia 09/30/2018    Consultants Cardiology, Dr. Beau Bound  Procedures Robotic Assisted Laparoscopic Cholecystectomy   Hospital Course:  Patient came into the ED at [facility] on 08/09/23, presenting with right shoulder pain radiating to the right upper abdomen. In the ED, he was found with leukocytosis with normal Hgb and normal hepatic function panel. CT of abdomen and pelvis showed moderate pericholecystic inflammatory changes consistent with acute cholecystitis. Patient was taken to the operating room on 08/10/23 for Robotic-Assisted Laparoscopic Cholecystectomy. Patient tolerated procedure well. In PACU, he went into A-fib RVR and soon after spontaneously converted to sinus rhythm. Cardiology was consulted. He was given Eliquis for stroke prevention. Patient will continue to follow-up with cardiology outpatient.  Patient has been tolerating diet. No evidence of infection around incision sites. Pain is well managed with pain medication. Patient is clear from surgical stand point. He will have a follow-up in two weeks with Dr. Charmel Cooter.   Physical Examination:  Constitutional: alert, in no acute distress Pulmonary:  normal breath sounds  Cardiac: normal rate and rhythm Gastrointestinal: soft, non-distended and non-tender, JP drain with serosanguinous output was removed  Skin: abdominal incisions look clear, dry and intact with surgical glue in place    Allergies as of 08/11/2023       Reactions   Metronidazole Itching, Swelling, Rash   Penicillins Rash        Medication List     TAKE these medications    amLODipine  10 MG tablet Commonly known as: NORVASC  Take 10 mg by mouth daily.    apixaban 5 MG Tabs tablet Commonly known as: ELIQUIS Take 1 tablet (5 mg total) by mouth 2 (two) times daily.   Artificial Tears 1.4 % ophthalmic solution Generic drug: polyvinyl alcohol Apply 1-2 drops to eye daily as needed.   aspirin  EC 81 MG tablet Take 81 mg by mouth at bedtime.   atorvastatin  40 MG tablet Commonly known as: LIPITOR Take 40 mg by mouth every evening.   cholecalciferol 25 MCG (1000 UNIT) tablet Commonly known as: VITAMIN D3 Take 1,000 Units by mouth daily.   citalopram  40 MG tablet Commonly known as: CELEXA  Take 40 mg by mouth daily.   HYDROcodone-acetaminophen  5-325 MG tablet Commonly known as: NORCO/VICODIN Take 1 tablet by mouth every 6 (six) hours as needed for up to 3 days.   metoprolol  tartrate 50 MG tablet Commonly known as: LOPRESSOR  Take 50 mg by mouth 2 (two) times a day.   Systane ICaps AREDS2 Caps Take 1 capsule by mouth 2 (two) times daily.   traZODone  50 MG tablet Commonly known as: DESYREL  Take 250 mg by mouth at bedtime.   valsartan 160 MG tablet Commonly known as: DIOVAN Take 1 tablet by mouth 2 (two) times daily.   zinc sulfate (50mg  elemental zinc) 220 (50 Zn) MG capsule Take 220 mg by mouth daily.          Follow-up Information     Paraschos, Alexander, MD. Go in 1 week(s).   Specialty: Cardiology Why: Appointment scheduled with Dr. Parks Bollman 05/27 at 2:00 PM Contact information: [address] [facility] [place] [address] [phone]         Eldred Grego, MD Follow up in 2 week(s).  Specialty: General Surgery Why: Post-op cholecystectomy Contact information: [address] [place] [address] [phone]                  Time spent on discharge management including discussion of hospital course, clinical condition, outpatient instructions, prescriptions, and follow up with the patient and members of the medical team: >30 minutes  Angelyne Terwilliger  Barrientos PA-C

## 2023-08-11 NOTE — Telephone Encounter (Signed)

## 2023-08-11 NOTE — TOC CM/SW Note (Signed)
 Transition of Care [facility]) - Inpatient Brief Assessment   Patient Details  Name: Alejandro Sheppard MRN: [id number] Date of Birth: 11-04-1934  Transition of Care [facility]) CM/SW Contact:    Loman Risk, RN Phone Number: 08/11/2023, 12:39 PM   Clinical Narrative:   Transition of Care [facility]) Screening Note   Patient Details  Name: Alejandro Sheppard Date of Birth: [birth year]   Transition of Care [facility]) CM/SW Contact:    Loman Risk, RN Phone Number: 08/11/2023, 12:39 PM    Transition of Care Department [facility]) has reviewed patient and no TOC needs have been identified at this time.  If new patient transition needs arise, please place a TOC consult.    Transition of Care Asessment: Insurance and Status: Insurance coverage has been reviewed Patient has primary care physician: Yes     Prior/Current Home Services: No current home services Social Drivers of Health Review: SDOH reviewed no interventions necessary Readmission risk has been reviewed: Yes Transition of care needs: no transition of care needs at this time

## 2023-08-11 NOTE — Plan of Care (Signed)

## 2023-08-11 NOTE — Progress Notes (Signed)
 [facility] CARDIOLOGY PROGRESS NOTE       Patient ID: Alejandro Sheppard MRN: [id number] DOB/AGE: 08/29/1934 89 y.o.  Admit date: 08/09/2023 Referring Physician Dr. Girard Lam Primary Physician Lyle San, MD Primary Cardiologist Dr. Parks Bollman Reason for Consultation Atrial Fibrillation RVR  HPI: Alejandro Sheppard is a 89 y.o. male  with a past medical history of hypertension, hyperlipidemia, mild aortic stenosis, moderate aortic insufficiency, sinus bradycardia secondary to metoprolol , and no known history of atrial fibrillation who presented to ED on 08/09/2023 for chest pain and right should pain. Patient found to have acute cholecystitis and underwent robotic laparoscopic cholecystectomy on 05/20. Post-op developed atrial fibrillation RVR and soon after spontaneously converted to sinus rhythm. Cardiology was consulted for further evaluation.   Interval History: -Patient seen and examined this AM and laying comfortably in hospital bed. Patient states he feels good this morning and denies chest pain, palpitations, SOB, lightheadedness or dizziness.  -Patients BP elevated and HR  stable. Overnight Tele showed no significant events, no evidence of atrial fibrillation overnight. Patient in sinus bradycardia, rate 50s  -Electrolytes  are stable.  -Patient remains on room air with stable SpO2.   Review of systems complete and found to be negative unless listed above    Past Medical History:  Diagnosis Date   Hypertension    Hyponatremia     Past Surgical History:  Procedure Laterality Date   DG ANKLE LEFT COMPLETE ([facility] HX)     SPINAL FUSION      Medications Prior to Admission  Medication Sig Dispense Refill Last Dose/Taking   amLODipine  (NORVASC ) 10 MG tablet Take 10 mg by mouth daily.   08/09/2023 at  3:00 PM   aspirin  EC 81 MG tablet Take 81 mg by mouth at bedtime.   08/08/2023   atorvastatin  (LIPITOR) 40 MG tablet Take 40 mg by mouth every evening.    08/09/2023 at  3:00 PM    cholecalciferol (VITAMIN D3) 25 MCG (1000 UNIT) tablet Take 1,000 Units by mouth daily.   08/08/2023   citalopram  (CELEXA ) 40 MG tablet Take 40 mg by mouth daily.   08/09/2023 at  6:15 AM   Multiple Vitamins-Minerals (SYSTANE ICAPS AREDS2) CAPS Take 1 capsule by mouth 2 (two) times daily.   08/09/2023 at  3:00 PM   traZODone  (DESYREL ) 50 MG tablet Take 250 mg by mouth at bedtime.   08/08/2023   valsartan (DIOVAN) 160 MG tablet Take 1 tablet by mouth 2 (two) times daily.   08/09/2023 at  6:15 AM   zinc sulfate, 50mg  elemental zinc, 220 (50 Zn) MG capsule Take 220 mg by mouth daily.   08/08/2023   metoprolol  tartrate (LOPRESSOR ) 50 MG tablet Take 50 mg by mouth 2 (two) times a day. (Patient not taking: Reported on 08/10/2023)   Not Taking   polyvinyl alcohol (ARTIFICIAL TEARS) 1.4 % ophthalmic solution Apply 1-2 drops to eye daily as needed. (Patient not taking: Reported on 08/10/2023)   Not Taking   Social History   Socioeconomic History   Marital status: Married    Spouse name: Not on file   Number of children: Not on file   Years of education: Not on file   Highest education level: Not on file  Occupational History   Not on file  Tobacco Use   Smoking status: Never   Smokeless tobacco: Never  Substance and Sexual Activity   Alcohol use: No   Drug use: No   Sexual activity: Not on file  Other  Topics Concern   Not on file  Social History Narrative   Not on file   Social Drivers of Health   Financial Resource Strain: Low Risk  (12/17/2022)   Received from [facility]   Overall Financial Resource Strain (CARDIA)    Difficulty of Paying Living Expenses: Not hard at all  Food Insecurity: No Food Insecurity (08/10/2023)   Hunger Vital Sign    Worried About Running Out of Food in the Last Year: Never true    Ran Out of Food in the Last Year: Never true  Transportation Needs: No Transportation Needs (08/10/2023)   PRAPARE - Administrator, Civil Service (Medical):  No    Lack of Transportation (Non-Medical): No  Physical Activity: Not on file  Stress: Not on file  Social Connections: Socially Integrated (08/10/2023)   Social Connection and Isolation Panel [NHANES]    Frequency of Communication with Friends and Family: More than three times a week    Frequency of Social Gatherings with Friends and Family: Twice a week    Attends Religious Services: More than 4 times per year    Active Member of [employer] or Organizations: Yes    Attends Banker Meetings: Never    Marital Status: Married  Catering manager Violence: Not At Risk (08/10/2023)   Humiliation, Afraid, Rape, and Kick questionnaire    Fear of Current or Ex-Partner: No    Emotionally Abused: No    Physically Abused: No    Sexually Abused: No    Family History  Problem Relation Age of Onset   CVA Mother    Depression Father      Vitals:   08/10/23 1721 08/10/23 1742 08/10/23 2048 08/11/23 0337  BP:  (!) 144/48 (!) 137/48 (!) 159/49  Pulse: (!) 56 (!) 58 (!) 57 (!) 57  Resp:  16 16 12   Temp: (!) 97.2 F (36.2 C) 98.1 F (36.7 C) 98.3 F (36.8 C) 98.6 F (37 C)  TempSrc:   Oral Oral  SpO2: 93% 91% 92% 94%  Weight:      Height:        PHYSICAL EXAM General: well appearing elderly male, well nourished, in no acute distress. HEENT: Normocephalic and atraumatic. Neck: No JVD.   Lungs: Normal respiratory effort on room air. Clear bilaterally to auscultation. No wheezes, crackles, rhonchi.  Heart: HRR, slow HR. Normal S1 and S2 without gallops or murmurs.  Abdomen: Non-distended appearing.  Msk: Normal strength and tone for age. Extremities: Warm and well perfused. No clubbing, cyanosis. No edema.  Neuro: Alert and oriented X 3. Psych: Answers questions appropriately.   Labs: Basic Metabolic Panel: Recent Labs    08/09/23 1912 08/11/23 0509  NA 130* 134*  K 4.4 4.0  CL 96* 104  CO2 22 25  GLUCOSE 133* 108*  BUN 19 21  CREATININE 0.77 0.77  CALCIUM  8.9 8.3*    Liver Function Tests: Recent Labs    08/09/23 1915  AST 27  ALT 20  ALKPHOS 66  BILITOT 0.9  PROT 6.7  ALBUMIN 3.9   No results for input(s): "LIPASE", "AMYLASE" in the last 72 hours. CBC: Recent Labs    08/09/23 1912 08/11/23 0509  WBC 15.2* 12.4*  HGB 13.6 11.3*  HCT 40.8 32.9*  MCV 93.8 92.7  PLT 158 119*   Cardiac Enzymes: Recent Labs    08/09/23 1915 08/09/23 2225  TROPONINIHS 9 9   BNP: No results for input(s): "BNP" in the  last 72 hours. D-Dimer: No results for input(s): "DDIMER" in the last 72 hours. Hemoglobin A1C: No results for input(s): "HGBA1C" in the last 72 hours. Fasting Lipid Panel: No results for input(s): "CHOL", "HDL", "LDLCALC", "TRIG", "CHOLHDL", "LDLDIRECT" in the last 72 hours. Thyroid Function Tests: No results for input(s): "TSH", "T4TOTAL", "T3FREE", "THYROIDAB" in the last 72 hours.  Invalid input(s): "FREET3" Anemia Panel: No results for input(s): "VITAMINB12", "FOLATE", "FERRITIN", "TIBC", "IRON", "RETICCTPCT" in the last 72 hours.   Radiology: ECHOCARDIOGRAM COMPLETE Result Date: 08/11/2023    ECHOCARDIOGRAM REPORT   Patient Name:   Alejandro Sheppard Date of Exam: 08/10/2023 Medical Rec #:  [id number]    Height:       70.0 in Accession #:    [id number]   Weight:       160.9 lb Date of Birth:  [birth year]    BSA:          1.903 m Patient Age:    89 years     BP:           152/67 mmHg Patient Gender: M            HR:           61 bpm. Exam Location:  [facility] Procedure: 2D Echo, Cardiac Doppler and Color Doppler (Both Spectral and Color            Flow Doppler were utilized during procedure). Indications:     I48.91 Atrial Fibrillation  History:         Patient has no prior history of Echocardiogram examinations.                  Risk Factors:Hypertension.  Sonographer:     Brigid Canada RDCS Referring Phys:  [id number] Creighton Doffing Diagnosing Phys: Dwayne D Callwood MD IMPRESSIONS  1. Left ventricular ejection fraction, by estimation, is  60 to 65%. The left ventricle has normal function. The left ventricle has no regional wall motion abnormalities. Left ventricular diastolic parameters were normal. The global longitudinal strain is abnormal.  2. Right ventricular systolic function is normal. The right ventricular size is normal.  3. The mitral valve is normal in structure. Trivial mitral valve regurgitation.  4. The aortic valve is calcified. Aortic valve regurgitation is mild. Aortic valve sclerosis/calcification is present, without any evidence of aortic stenosis. FINDINGS  Left Ventricle: Left ventricular ejection fraction, by estimation, is 60 to 65%. The left ventricle has normal function. The left ventricle has no regional wall motion abnormalities. Strain was performed and the global longitudinal strain is abnormal. The left ventricular internal cavity size was normal in size. There is no left ventricular hypertrophy. Left ventricular diastolic parameters were normal. Right Ventricle: The right ventricular size is normal. No increase in right ventricular wall thickness. Right ventricular systolic function is normal. Left Atrium: Left atrial size was normal in size. Right Atrium: Right atrial size was normal in size. Pericardium: There is no evidence of pericardial effusion. Mitral Valve: The mitral valve is normal in structure. Trivial mitral valve regurgitation. Tricuspid Valve: The tricuspid valve is normal in structure. Tricuspid valve regurgitation is mild. The aortic valve is calcified. Aortic valve regurgitation is mild. Aortic valve sclerosis/calcification is present, without any evidence of aortic stenosis. Pulmonic Valve: The pulmonic valve was normal in structure. Pulmonic valve regurgitation is not visualized. Aorta: The ascending aorta was not well visualized. IAS/Shunts: No atrial level shunt detected by color flow Doppler. Additional Comments: 3D was performed not requiring image post  processing on an independent workstation and  was indeterminate.  LEFT VENTRICLE PLAX 2D LVIDd:         4.20 cm   Diastology LVIDs:         3.00 cm   LV e' medial:    6.31 cm/s LV PW:         1.20 cm   LV E/e' medial:  18.2 LV IVS:        1.10 cm   LV e' lateral:   12.15 cm/s LVOT diam:     2.20 cm   LV E/e' lateral: 9.5 LV SV:         106 LV SV Index:   56 LVOT Area:     3.80 cm  RIGHT VENTRICLE RV Basal diam:  4.20 cm RV S prime:     10.45 cm/s TAPSE (M-mode): 2.3 cm LEFT ATRIUM             Index        RIGHT ATRIUM           Index LA diam:        5.20 cm 2.73 cm/m   RA Area:     15.00 cm LA Vol (A2C):   48.8 ml 25.64 ml/m  RA Volume:   42.00 ml  22.07 ml/m LA Vol (A4C):   57.5 ml 30.21 ml/m LA Biplane Vol: 54.5 ml 28.64 ml/m  AORTIC VALVE AV Area (Vmax):    1.23 cm AV Area (Vmean):   1.17 cm AV Area (VTI):     1.26 cm AV Vmax:           331.20 cm/s AV Vmean:          238.600 cm/s AV VTI:            0.840 m AV Peak Grad:      43.9 mmHg AV Mean Grad:      25.8 mmHg LVOT Vmax:         107.00 cm/s LVOT Vmean:        73.200 cm/s LVOT VTI:          0.279 m LVOT/AV VTI ratio: 0.33 AI PHT:            340 msec  AORTA Ao Root diam: 3.40 cm Ao Asc diam:  3.20 cm MITRAL VALVE                TRICUSPID VALVE MV Area (PHT): 4.53 cm     TR Peak grad:   22.5 mmHg MV Decel Time: 168 msec     TR Vmax:        237.00 cm/s MV E velocity: 115.00 cm/s MV A velocity: 106.50 cm/s  SHUNTS MV E/A ratio:  1.08         Systemic VTI:  0.28 m                             Systemic Diam: 2.20 cm Antonette Batters MD Electronically signed by Antonette Batters MD Signature Date/Time: 08/11/2023/7:50:15 AM    Final    CT Angio Chest Pulmonary Embolism (PE) W or WO Contrast Result Date: 08/09/2023 CLINICAL DATA:  High probability pulmonary embolism, chest pain, right flank pain EXAM: CT ANGIOGRAPHY CHEST CT ABDOMEN AND PELVIS WITH CONTRAST TECHNIQUE: Multidetector CT imaging of the chest was performed using the standard protocol during bolus administration of intravenous contrast.  Multiplanar CT image reconstructions and MIPs were  obtained to evaluate the vascular anatomy. Multidetector CT imaging of the abdomen and pelvis was performed using the standard protocol during bolus administration of intravenous contrast. RADIATION DOSE REDUCTION: This exam was performed according to the departmental dose-optimization program which includes automated exposure control, adjustment of the mA and/or kV according to patient size and/or use of iterative reconstruction technique. CONTRAST:  OMNIPAQUE  IOHEXOL  350 MG/ML SOLN COMPARISON:  None Available. FINDINGS: CTA CHEST FINDINGS Cardiovascular: Adequate opacification of the pulmonary arterial tree. No intraluminal filling defect identified to suggest acute pulmonary embolism. Central pulmonary arteries are of normal caliber. Calcification of the aortic valve leaflets. Moderate coronary artery calcification. Cardiac size is within normal limits. No pericardial effusion. Moderate atherosclerotic calcification within the thoracic aorta. No aortic aneurysm. Mediastinum/Nodes: No enlarged mediastinal, hilar, or axillary lymph nodes. Thyroid gland, trachea, and esophagus demonstrate no significant findings. Lungs/Pleura: Eventration of the right hemidiaphragm anteriorly. Mild right basilar atelectasis. Lungs are otherwise clear. No pneumothorax or pleural effusion. Musculoskeletal: No acute bone abnormality. No lytic or blastic bone lesion. Osseous structures are age appropriate. Review of the MIP images confirms the above findings. CT ABDOMEN and PELVIS FINDINGS Hepatobiliary: Imaging is severely limited by motion artifact on portal venous phase. However, calming CT chest and delayed phase images demonstrates hyperemia of the gallbladder wall and moderate pericholecystic inflammatory changes in keeping with acute cholecystitis. No intra or extrahepatic biliary ductal dilation. Liver otherwise unremarkable. Pancreas: Unremarkable. No pancreatic ductal  dilatation or surrounding inflammatory changes. Spleen: Unremarkable Adrenals/Urinary Tract: Adrenal glands are unremarkable. Kidneys are normal, without renal calculi, focal lesion, or hydronephrosis. Multiple small bladder diverticular are identified suggesting changes of chronic bladder obstruction. Stomach/Bowel: Severe descending and sigmoid diverticulosis. Stomach, small bowel, and large bowel are otherwise unremarkable. Appendix is not clearly identified, however, no secondary signs of appendicitis within the right lower quadrant. No evidence of obstruction or focal inflammation. No free intraperitoneal gas or fluid. Vascular/Lymphatic: Aortic atherosclerosis. No enlarged abdominal or pelvic lymph nodes. Reproductive: Mild prostatic hypertrophy Other: No abdominal wall hernia or abnormality. No abdominopelvic ascites. Musculoskeletal: No acute bone abnormality. No lytic or blastic bone lesion. Osseous structures are age appropriate. Review of the MIP images confirms the above findings. IMPRESSION: 1. No acute pulmonary embolism. 2. Moderate coronary artery calcification. 3. Calcification of the aortic valve leaflets. If clinically indicated, echocardiography may be helpful to assess the degree of valvular dysfunction. 4. Acute cholecystitis. 5. Severe descending and sigmoid diverticulosis without evidence of acute diverticulitis. 6. Multiple small bladder diverticula suggesting changes of chronic bladder outlet obstruction. Aortic Atherosclerosis (ICD10-I70.0). Electronically Signed   By: Worthy Heads M.D.   On: 08/09/2023 23:51   CT ABDOMEN PELVIS W CONTRAST Result Date: 08/09/2023 CLINICAL DATA:  High probability pulmonary embolism, chest pain, right flank pain EXAM: CT ANGIOGRAPHY CHEST CT ABDOMEN AND PELVIS WITH CONTRAST TECHNIQUE: Multidetector CT imaging of the chest was performed using the standard protocol during bolus administration of intravenous contrast. Multiplanar CT image reconstructions  and MIPs were obtained to evaluate the vascular anatomy. Multidetector CT imaging of the abdomen and pelvis was performed using the standard protocol during bolus administration of intravenous contrast. RADIATION DOSE REDUCTION: This exam was performed according to the departmental dose-optimization program which includes automated exposure control, adjustment of the mA and/or kV according to patient size and/or use of iterative reconstruction technique. CONTRAST:  OMNIPAQUE  IOHEXOL  350 MG/ML SOLN COMPARISON:  None Available. FINDINGS: CTA CHEST FINDINGS Cardiovascular: Adequate opacification of the pulmonary arterial tree. No intraluminal filling defect  identified to suggest acute pulmonary embolism. Central pulmonary arteries are of normal caliber. Calcification of the aortic valve leaflets. Moderate coronary artery calcification. Cardiac size is within normal limits. No pericardial effusion. Moderate atherosclerotic calcification within the thoracic aorta. No aortic aneurysm. Mediastinum/Nodes: No enlarged mediastinal, hilar, or axillary lymph nodes. Thyroid gland, trachea, and esophagus demonstrate no significant findings. Lungs/Pleura: Eventration of the right hemidiaphragm anteriorly. Mild right basilar atelectasis. Lungs are otherwise clear. No pneumothorax or pleural effusion. Musculoskeletal: No acute bone abnormality. No lytic or blastic bone lesion. Osseous structures are age appropriate. Review of the MIP images confirms the above findings. CT ABDOMEN and PELVIS FINDINGS Hepatobiliary: Imaging is severely limited by motion artifact on portal venous phase. However, calming CT chest and delayed phase images demonstrates hyperemia of the gallbladder wall and moderate pericholecystic inflammatory changes in keeping with acute cholecystitis. No intra or extrahepatic biliary ductal dilation. Liver otherwise unremarkable. Pancreas: Unremarkable. No pancreatic ductal dilatation or surrounding inflammatory  changes. Spleen: Unremarkable Adrenals/Urinary Tract: Adrenal glands are unremarkable. Kidneys are normal, without renal calculi, focal lesion, or hydronephrosis. Multiple small bladder diverticular are identified suggesting changes of chronic bladder obstruction. Stomach/Bowel: Severe descending and sigmoid diverticulosis. Stomach, small bowel, and large bowel are otherwise unremarkable. Appendix is not clearly identified, however, no secondary signs of appendicitis within the right lower quadrant. No evidence of obstruction or focal inflammation. No free intraperitoneal gas or fluid. Vascular/Lymphatic: Aortic atherosclerosis. No enlarged abdominal or pelvic lymph nodes. Reproductive: Mild prostatic hypertrophy Other: No abdominal wall hernia or abnormality. No abdominopelvic ascites. Musculoskeletal: No acute bone abnormality. No lytic or blastic bone lesion. Osseous structures are age appropriate. Review of the MIP images confirms the above findings. IMPRESSION: 1. No acute pulmonary embolism. 2. Moderate coronary artery calcification. 3. Calcification of the aortic valve leaflets. If clinically indicated, echocardiography may be helpful to assess the degree of valvular dysfunction. 4. Acute cholecystitis. 5. Severe descending and sigmoid diverticulosis without evidence of acute diverticulitis. 6. Multiple small bladder diverticula suggesting changes of chronic bladder outlet obstruction. Aortic Atherosclerosis (ICD10-I70.0). Electronically Signed   By: Worthy Heads M.D.   On: 08/09/2023 23:51   DG Chest Port 1 View Result Date: 08/09/2023 CLINICAL DATA:  [id number] Pain [id number] EXAM: PORTABLE CHEST - 1 VIEW COMPARISON:  September 30, 2018 FINDINGS: Low lung volumes. Elevation of the right hemidiaphragm with patchy opacities in the right lung base. No focal airspace consolidation, pleural effusion, or pneumothorax. Mild cardiomegaly. No acute fracture or destructive lesion. Multilevel thoracic osteophytosis. Rotator  cuff anchors in the left humeral head. Superior subluxation of the right humerus, consistent with chronic rotator cuff tear. IMPRESSION: Low lung volumes. Patchy opacities in the right lung base, which may represent atelectasis or a developing bronchopneumonia, in the correct clinical context. Electronically Signed   By: Rance Burrows M.D.   On: 08/09/2023 20:10    ECHO as above.  TELEMETRY reviewed by me 08/11/2023: sinus rhythm, rate 50s  EKG reviewed by me: atrial fibrillation with RVR, LBBB, rate 114 bpm (EKG ED sinus bradycardia, rate 54 bpm)  Data reviewed by me 08/11/2023: last 24h vitals tele labs imaging I/O ED provider note, admission H&P, general surgery note.  Principal Problem:   Acute cholecystitis    ASSESSMENT AND PLAN:  Alejandro Sheppard is a 89 y.o. male  with a past medical history of hypertension, hyperlipidemia, mild aortic stenosis, moderate aortic insufficiency, sinus bradycardia secondary to metoprolol , and no known history of atrial fibrillation who presented to ED on 08/09/2023 for  chest pain and right should pain. Patient found to have acute cholecystitis and underwent robotic laparoscopic cholecystectomy on 05/20. Post-op developed atrial fibrillation RVR and soon after spontaneously converted to sinus rhythm. Cardiology was consulted for further evaluation.   # Acute cholecystitis s/p cholecystectomy (05/20) # Atrial fibrillation RVR, developed post-operatively  # Hypertension Patient with no known history of atrial fibrillation developed asymptomatic atrial fibrillation RVR rates 110s postoperatively s/p cholecystectomy and shortly after spontaneously converted back into sinus rhythm. Per monitor patient remains in sinus rhythm, rate 50s. Patient has remained asymptomatic. Echo this admission with pEF, no RWMA. -CHADS2VASC of 3 in the setting of age,and HTN history. Would recommend starting anticoagulation for stroke risk reduction. Patient denies any recent falls, or  prior major bleeding.  -Continue PO eliquis 5 mg twice daily. This was cleared by general surgery team. -Continue home metoprolol  tartrate 50 mg twice daily. -Continue home amlodipine  10 mg daily. -Outpatient evaluation of atrial fibrillation burden and if patient requires anticoagulation long-term. Unsure if atrial fibrillation only occurred in the setting of surgery/anesthesia.   Follow-up appointment scheduled with Dr. Parks Bollman on 05/27 at 2:00 PM   This patient's plan of care was discussed and created with Dr. Beau Bound and he is in agreement.  Signed: Creighton Doffing, PA-C  08/11/2023, 8:19 AM [facility]

## 2023-08-11 NOTE — Discharge Instructions (Addendum)

## 2023-08-11 NOTE — Care Management Obs Status (Signed)
 MEDICARE OBSERVATION STATUS NOTIFICATION   Patient Details  Name: Alejandro Sheppard MRN: [id number] Date of Birth: Jun 06, 1934   Medicare Observation Status Notification Given:  Yes    Loman Risk, RN 08/11/2023, 12:07 PM
